# Patient Record
Sex: Male | Born: 1975
Health system: Southern US, Community
[De-identification: ages and names within clinical notes are randomized; demographics above are authoritative.]

## PROBLEM LIST (undated history)

## (undated) DIAGNOSIS — Z8619 Personal history of other infectious and parasitic diseases: Secondary | ICD-10-CM

## (undated) DIAGNOSIS — Z049 Encounter for examination and observation for unspecified reason: Secondary | ICD-10-CM

## (undated) HISTORY — DX: Personal history of other infectious and parasitic diseases: Z86.19

## (undated) HISTORY — DX: Encounter for examination and observation for unspecified reason: Z04.9

## (undated) HISTORY — PX: LASIK: SHX215

---

## 2014-01-17 ENCOUNTER — Telehealth: Payer: Self-pay

## 2014-01-17 NOTE — Telephone Encounter (Signed)
Medication List and allergies:  Updated   90 day supply/mail order: n/a [facility]  Immunization due:  UTD  A/P: Personal, family or PSH- Updated Flu- 09/2013 Tdap- Stated he has had within the last 3 years.     To discuss with provider: Not at this time.

## 2014-01-19 ENCOUNTER — Encounter: Payer: Self-pay | Admitting: Family Medicine

## 2014-01-19 ENCOUNTER — Ambulatory Visit (INDEPENDENT_AMBULATORY_CARE_PROVIDER_SITE_OTHER): Payer: 59 | Admitting: Family Medicine

## 2014-01-19 VITALS — BP 120/78 | HR 90 | Temp 98.2°F | Resp 16 | Ht 74.5 in | Wt 193.1 lb

## 2014-01-19 DIAGNOSIS — Z0000 Encounter for general adult medical examination without abnormal findings: Secondary | ICD-10-CM | POA: Insufficient documentation

## 2014-01-19 DIAGNOSIS — B079 Viral wart, unspecified: Secondary | ICD-10-CM

## 2014-01-19 LAB — BASIC METABOLIC PANEL
BUN: 19 mg/dL (ref 6–23)
CO2: 24 meq/L (ref 19–32)
Calcium: 9.8 mg/dL (ref 8.4–10.5)
Chloride: 111 mEq/L (ref 96–112)
Creatinine, Ser: 1.4 mg/dL (ref 0.4–1.5)
GFR: 61.42 mL/min (ref >60.00)
GLUCOSE: 94 mg/dL (ref 70–99)
POTASSIUM: 4.6 meq/L (ref 3.5–5.1)
SODIUM: 147 meq/L — AB (ref 135–145)

## 2014-01-19 LAB — HEPATIC FUNCTION PANEL
ALK PHOS: 59 U/L (ref 39–117)
ALT: 29 U/L (ref 0–53)
AST: 25 U/L (ref 0–37)
Albumin: 4.7 g/dL (ref 3.5–5.2)
Bilirubin, Direct: 0 mg/dL (ref 0.0–0.3)
TOTAL PROTEIN: 8.4 g/dL — AB (ref 6.0–8.3)
Total Bilirubin: 0.9 mg/dL (ref 0.3–1.2)

## 2014-01-19 LAB — CBC WITH DIFFERENTIAL/PLATELET
Basophils Absolute: 0.1 10*3/uL (ref 0.0–0.1)
Basophils Relative: 1.4 % (ref 0.0–3.0)
Eosinophils Absolute: 0.1 10*3/uL (ref 0.0–0.7)
Eosinophils Relative: 2 % (ref 0.0–5.0)
HEMATOCRIT: 43.7 % (ref 39.0–52.0)
Hemoglobin: 14.5 g/dL (ref 13.0–17.0)
LYMPHS ABS: 1.9 10*3/uL (ref 0.7–4.0)
Lymphocytes Relative: 37.5 % (ref 12.0–46.0)
MCHC: 33.2 g/dL (ref 30.0–36.0)
MCV: 92.3 fl (ref 78.0–100.0)
MONOS PCT: 6.9 % (ref 3.0–12.0)
Monocytes Absolute: 0.3 10*3/uL (ref 0.1–1.0)
NEUTROS PCT: 52.2 % (ref 43.0–77.0)
Neutro Abs: 2.7 10*3/uL (ref 1.4–7.7)
Platelets: 203 10*3/uL (ref 150.0–400.0)
RBC: 4.73 Mil/uL (ref 4.22–5.81)
RDW: 12.9 % (ref 11.5–14.6)
WBC: 5.1 10*3/uL (ref 4.5–10.5)

## 2014-01-19 LAB — LIPID PANEL
CHOL/HDL RATIO: 4
Cholesterol: 167 mg/dL (ref 0–200)
HDL: 39.5 mg/dL (ref >39.00)
LDL Cholesterol: 97 mg/dL (ref 0–99)
Triglycerides: 155 mg/dL — ABNORMAL HIGH (ref 0.0–149.0)
VLDL: 31 mg/dL (ref 0.0–40.0)

## 2014-01-19 LAB — TSH: TSH: 2.6 u[IU]/mL (ref 0.35–5.50)

## 2014-01-19 NOTE — Progress Notes (Signed)
Pre visit review using our clinic review tool, if applicable. No additional management support is needed unless otherwise documented below in the visit note. 

## 2014-01-19 NOTE — Patient Instructions (Signed)
Follow up in 1 year or as needed Neosporin to the thumb (the surrounding skin may ulcerate) We'll notify you of your lab results and make any changes if needed Keep up the good work! Call with any questions or concerns Welcome!  We're glad to have you!

## 2014-01-19 NOTE — Assessment & Plan Note (Signed)
New.  Pt tolerated 3 cycles of freeze/thaw w/ liquid nitrogen.  Will follow.

## 2014-01-19 NOTE — Assessment & Plan Note (Signed)
Pt's PE WNL.  Check labs.  Anticipatory guidance provided.  

## 2014-01-19 NOTE — Progress Notes (Signed)
   Subjective:    Patient ID: Louis Walters, male    DOB: [birth year], 38 y.o.   MRN: [id number]  HPI New to establish.  Previous MD- none.  CPE- no current concerns, has wart on R thumb pad.   Review of Systems Patient reports no vision/hearing changes, anorexia, fever ,adenopathy, persistant/recurrent hoarseness, swallowing issues, chest pain, palpitations, edema, persistant/recurrent cough, hemoptysis, dyspnea (rest,exertional, paroxysmal nocturnal), gastrointestinal  bleeding (melena, rectal bleeding), abdominal pain, excessive heart burn, GU symptoms (dysuria, hematuria, voiding/incontinence issues) syncope, focal weakness, memory loss, numbness & tingling, skin/hair/nail changes, depression, anxiety, abnormal bruising/bleeding, musculoskeletal symptoms/signs.     Objective:   Physical Exam BP 120/78  Pulse 90  Temp(Src) 98.2 F (36.8 C) (Oral)  Resp 16  Ht 6' 2.5" (1.892 m)  Wt 193 lb 2 oz (87.601 kg)  BMI 24.47 kg/m2  SpO2 98%  General Appearance:    Alert, cooperative, no distress, appears stated age  Head:    Normocephalic, without obvious abnormality, atraumatic  Eyes:    PERRL, conjunctiva/corneas clear, EOM's intact, fundi    benign, both eyes       Ears:    Normal TM's and external ear canals, both ears  Nose:   Nares normal, septum midline, mucosa normal, no drainage   or sinus tenderness  Throat:   Lips, mucosa, and tongue normal; teeth and gums normal  Neck:   Supple, symmetrical, trachea midline, no adenopathy;       thyroid:  No enlargement/tenderness/nodules  Back:     Symmetric, no curvature, ROM normal, no CVA tenderness  Lungs:     Clear to auscultation bilaterally, respirations unlabored  Chest wall:    No tenderness or deformity  Heart:    Regular rate and rhythm, S1 and S2 normal, no murmur, rub   or gallop  Abdomen:     Soft, non-tender, bowel sounds active all four quadrants,    no masses, no organomegaly  Genitalia:    Normal male without lesion,  discharge or tenderness  Rectal:    Deferred due to young age  Extremities:   Extremities normal, atraumatic, no cyanosis or edema  Pulses:   2+ and symmetric all extremities  Skin:   Skin color, texture, turgor normal, no rashes.  Wart w/ satellite lesions on R thumb pad  Lymph nodes:   Cervical, supraclavicular, and axillary nodes normal  Neurologic:   CNII-XII intact. Normal strength, sensation and reflexes      throughout          Assessment & Plan:

## 2014-01-22 ENCOUNTER — Encounter: Payer: Self-pay | Admitting: General Practice

## 2014-06-12 ENCOUNTER — Ambulatory Visit (INDEPENDENT_AMBULATORY_CARE_PROVIDER_SITE_OTHER): Payer: 59 | Admitting: Family Medicine

## 2014-06-12 ENCOUNTER — Encounter: Payer: Self-pay | Admitting: Family Medicine

## 2014-06-12 VITALS — BP 118/78 | HR 97 | Temp 98.1°F | Resp 16 | Wt 198.1 lb

## 2014-06-12 DIAGNOSIS — G4726 Circadian rhythm sleep disorder, shift work type: Secondary | ICD-10-CM

## 2014-06-12 DIAGNOSIS — B079 Viral wart, unspecified: Secondary | ICD-10-CM

## 2014-06-12 MED ORDER — TRAZODONE HCL 50 MG PO TABS: 25.0000 mg | ORAL_TABLET | Freq: Every evening | ORAL | Status: DC | PRN

## 2014-06-12 NOTE — Patient Instructions (Signed)
Follow up as needed You can continue to apply the Compound W on top of the freezing we did today If the surrounding skin blisters or burns- apply neosporin Start the Trazodone as needed for sleep Call with any questions or concerns Hang in there!

## 2014-06-12 NOTE — Assessment & Plan Note (Signed)
Pt has warts x3 on R thumb pad.  Tolerated 5 cycles of freeze/thaw w/ liquid nitrogen w/o difficulty.  Reviewed supportive care and ongoing treatment.  Will follow.

## 2014-06-12 NOTE — Assessment & Plan Note (Signed)
New.  Start Trazodone prn.  Pt to call if med is ineffective or he has side effects.  Pt expressed understanding and is in agreement w/ plan.

## 2014-06-12 NOTE — Progress Notes (Signed)
Pre visit review using our clinic review tool, if applicable. No additional management support is needed unless otherwise documented below in the visit note. 

## 2014-06-12 NOTE — Progress Notes (Signed)
   Subjective:    Patient ID: Louis Walters, male    DOB: July 28, 1976, 38 y.o.   MRN: [id number]  HPI Warts- R thumb pad x3, has been using OTC Compound W w/o desired results.  Wants them frozen w/ liquid Nitrogen today.  Sleep cycle disturbance- pt has to occasionally work 3rd shift and has difficulty sleeping when this happens.  Would like to have a sleep aide on hand for these occasions.   Review of Systems For ROS see HPI     Objective:   Physical Exam  Vitals reviewed. Constitutional: He is oriented to person, place, and time. He appears well-developed and well-nourished. No distress.  Neurological: He is alert and oriented to person, place, and time.  Skin: Skin is warm and dry.  3 nearly flat warts on R thumb pad, all <1 cm but largest w/ visible seed Pt tolerated 5 cycles of freeze/thaw w/ liquid nitrogen  Psychiatric: He has a normal mood and affect. His behavior is normal. Thought content normal.          Assessment & Plan:

## 2015-04-25 ENCOUNTER — Encounter: Payer: Self-pay | Admitting: Medical

## 2015-04-25 ENCOUNTER — Ambulatory Visit (HOSPITAL_BASED_OUTPATIENT_CLINIC_OR_DEPARTMENT_OTHER)
Admission: RE | Admit: 2015-04-25 | Discharge: 2015-04-25 | Disposition: A | Discharge status: Home / Self Care | Payer: 59 | Source: Ambulatory Visit | Attending: Medical | Admitting: Medical

## 2015-04-25 ENCOUNTER — Ambulatory Visit (INDEPENDENT_AMBULATORY_CARE_PROVIDER_SITE_OTHER): Payer: 59 | Admitting: Medical

## 2015-04-25 VITALS — BP 114/79 | HR 95 | Temp 98.3°F | Ht 74.5 in | Wt 201.0 lb

## 2015-04-25 DIAGNOSIS — M79671 Pain in right foot: Secondary | ICD-10-CM | POA: Diagnosis not present

## 2015-04-25 DIAGNOSIS — M79673 Pain in unspecified foot: Secondary | ICD-10-CM

## 2015-04-25 DIAGNOSIS — M79672 Pain in left foot: Secondary | ICD-10-CM | POA: Diagnosis present

## 2015-04-25 DIAGNOSIS — M545 Low back pain: Secondary | ICD-10-CM

## 2015-04-25 DIAGNOSIS — M549 Dorsalgia, unspecified: Secondary | ICD-10-CM | POA: Insufficient documentation

## 2015-04-25 DIAGNOSIS — M722 Plantar fascial fibromatosis: Secondary | ICD-10-CM | POA: Insufficient documentation

## 2015-04-25 NOTE — Patient Instructions (Signed)
Heel pain Recommend heel pads. Alleve or ibuprofen otc. Calf stretches. Xray today. If this persist then offer sports medicine referral.   Back pain Conservative measures. Stretches continue. Core abdomen exercises. If you desire can refer to PT and get xray lumbar spine with [facility]/    Recommend ellipitical or bike riding.  Follow up 2-3 wks or as needed.

## 2015-04-25 NOTE — Assessment & Plan Note (Signed)
Conservative measures. Stretches continue. Core abdomen exercises. If you desire can refer to PT and get xray lumbar spine with [facility]/

## 2015-04-25 NOTE — Assessment & Plan Note (Signed)
Recommend heel pads. Alleve or ibuprofen otc. Calf stretches. Xray today. If this persist then offer sports medicine referral.

## 2015-04-25 NOTE — Progress Notes (Signed)
Pre visit review using our clinic review tool, if applicable. No additional management support is needed unless otherwise documented below in the visit note. 

## 2015-04-25 NOTE — Progress Notes (Signed)
Subjective:    Patient ID: Louis Walters, male    DOB: 1976/08/25, 39 y.o.   MRN: [id number]  HPI  Pt in with some left foot pain. Pain for a 4-5  weeks. Pt thinks he has plantar fascitiis. Pt states pain escalated on Tuesday am. When he first gets up in am pain is severe. Pt on Monday jogged 3/4 of a mile. Pt just restarted jogging. Some faint heel pain rt heel.   Pt states also some mild lower back pain. Pt has seen chiropracter. Back pain has been going off and on for six months. Waxes and wanes. Pain now level 1-2. Pain never completley went away. No radiating pain. No weakness to legs.    Review of Systems  Constitutional: Negative for fever, chills, diaphoresis, activity change and fatigue.  Respiratory: Negative for cough, chest tightness and shortness of breath.   Cardiovascular: Negative for chest pain, palpitations and leg swelling.  Gastrointestinal: Negative for nausea, vomiting and abdominal pain.  Musculoskeletal: Negative for neck pain and neck stiffness.       Back pain minimal. Bilateral heel pain. Lt side>than rt side.  Neurological: Negative for dizziness, tremors, seizures, syncope, facial asymmetry, speech difficulty, weakness, light-headedness, numbness and headaches.  Psychiatric/Behavioral: Negative for behavioral problems, confusion and agitation. The patient is not nervous/anxious.      Past Medical History  Diagnosis Date  . No disease found   . History of chicken pox     History   Social History  . Marital Status: Married    Spouse Name: N/A  . Number of Children: N/A  . Years of Education: N/A   Occupational History  . Not on file.   Social History Main Topics  . Smoking status: Never Smoker   . Smokeless tobacco: Never Used  . Alcohol Use: No  . Drug Use: No  . Sexual Activity: Yes   Other Topics Concern  . Not on file   Social History Narrative    Past Surgical History  Procedure Laterality Date  . Lasik      Family  History  Problem Relation Age of Onset  . Osteoporosis Mother   . Cancer Maternal Grandfather     colon    Allergies  Allergen Reactions  . Celebrex [Celecoxib] Rash    Current Outpatient Prescriptions on File Prior to Visit  Medication Sig Dispense Refill  . traZODone (DESYREL) 50 MG tablet Take 0.5-1 tablets (25-50 mg total) by mouth at bedtime as needed for sleep. 30 tablet 3   No current facility-administered medications on file prior to visit.    BP 114/79 mmHg  Pulse 95  Temp(Src) 98.3 F (36.8 C) (Oral)  Ht 6' 2.5" (1.892 m)  Wt 201 lb (91.173 kg)  BMI 25.47 kg/m2  SpO2 98%       Objective:   Physical Exam   General Appearance- Not in acute distress.    Chest and Lung Exam Auscultation: Breath sounds:-Normal. Clear even and unlabored. Adventitious sounds:- No Adventitious sounds.  Cardiovascular Auscultation:Rythm - Regular, rate and rythm. Heart Sounds -Normal heart sounds.  Abdomen Inspection:-Inspection Normal.  Palpation/Perucssion: Palpation and Percussion of the abdomen reveal- Non Tender, No Rebound tenderness, No rigidity(Guarding) and No Palpable abdominal masses.  Liver:-Normal.  Spleen:- Normal.   Back  no Mid lumbar spine tenderness to palpation. Pain on straight leg lift. faint   Lower ext neurologic  L5-S1 sensation intact bilaterally. Normal patellar reflexes bilaterally. No foot drop bilaterally.  Lt foot- mild  heel pain to palpation bottom calcaneus and below achilles. But no pain on achilles. Rt foot- no obvious pain on palpation     Assessment & Plan:

## 2015-04-29 ENCOUNTER — Encounter: Payer: Self-pay | Admitting: Family Medicine

## 2015-04-29 ENCOUNTER — Ambulatory Visit (INDEPENDENT_AMBULATORY_CARE_PROVIDER_SITE_OTHER): Payer: 59 | Admitting: Family Medicine

## 2015-04-29 VITALS — BP 112/76 | HR 106 | Temp 98.2°F | Resp 16 | Ht 75.0 in | Wt 197.4 lb

## 2015-04-29 DIAGNOSIS — Z0000 Encounter for general adult medical examination without abnormal findings: Secondary | ICD-10-CM

## 2015-04-29 NOTE — Progress Notes (Signed)
   Subjective:    Patient ID: Louis Walters, male    DOB: [birth year], 39 y.o.   MRN: [id number]  HPI CPE- no concerns today.   Review of Systems Patient reports no vision/hearing changes, anorexia, fever ,adenopathy, persistant/recurrent hoarseness, swallowing issues, chest pain, palpitations, edema, persistant/recurrent cough, hemoptysis, dyspnea (rest,exertional, paroxysmal nocturnal), gastrointestinal  bleeding (melena, rectal bleeding), abdominal pain, excessive heart burn, GU symptoms (dysuria, hematuria, voiding/incontinence issues) syncope, focal weakness, memory loss, numbness & tingling, skin/hair/nail changes, depression, anxiety, abnormal bruising/bleeding, musculoskeletal symptoms/signs.     Objective:   Physical Exam General Appearance:    Alert, cooperative, no distress, appears stated age  Head:    Normocephalic, without obvious abnormality, atraumatic  Eyes:    PERRL, conjunctiva/corneas clear, EOM's intact, fundi    benign, both eyes       Ears:    Normal TM's and external ear canals, both ears  Nose:   Nares normal, septum midline, mucosa normal, no drainage   or sinus tenderness  Throat:   Lips, mucosa, and tongue normal; teeth and gums normal  Neck:   Supple, symmetrical, trachea midline, no adenopathy;       thyroid:  No enlargement/tenderness/nodules  Back:     Symmetric, no curvature, ROM normal, no CVA tenderness  Lungs:     Clear to auscultation bilaterally, respirations unlabored  Chest wall:    No tenderness or deformity  Heart:    Regular rate and rhythm, S1 and S2 normal, no murmur, rub   or gallop  Abdomen:     Soft, non-tender, bowel sounds active all four quadrants,    no masses, no organomegaly  Genitalia:    Deferred due to daughter's presence  Rectal:    Extremities:   Extremities normal, atraumatic, no cyanosis or edema  Pulses:   2+ and symmetric all extremities  Skin:   Skin color, texture, turgor normal, no rashes or lesions  Lymph nodes:    Cervical, supraclavicular, and axillary nodes normal  Neurologic:   CNII-XII intact. Normal strength, sensation and reflexes      throughout          Assessment & Plan:

## 2015-04-29 NOTE — Patient Instructions (Signed)
Follow up in 1 year or as needed We'll notify you of your lab results and make any changes if needed Keep up the good work!  You look great! Call with any questions or concerns Happy Spring!!! 

## 2015-04-29 NOTE — Progress Notes (Signed)
Pre visit review using our clinic review tool, if applicable. No additional management support is needed unless otherwise documented below in the visit note. 

## 2015-04-30 LAB — CBC WITH DIFFERENTIAL/PLATELET
BASOS PCT: 0.2 % (ref 0.0–3.0)
Basophils Absolute: 0 10*3/uL (ref 0.0–0.1)
EOS ABS: 0.1 10*3/uL (ref 0.0–0.7)
Eosinophils Relative: 2.3 % (ref 0.0–5.0)
HCT: 41.3 % (ref 39.0–52.0)
Hemoglobin: 14.1 g/dL (ref 13.0–17.0)
Lymphocytes Relative: 21.9 % (ref 12.0–46.0)
Lymphs Abs: 1.4 10*3/uL (ref 0.7–4.0)
MCHC: 34.2 g/dL (ref 30.0–36.0)
MCV: 89.7 fl (ref 78.0–100.0)
MONO ABS: 0.2 10*3/uL (ref 0.1–1.0)
Monocytes Relative: 3.2 % (ref 3.0–12.0)
NEUTROS ABS: 4.7 10*3/uL (ref 1.4–7.7)
Neutrophils Relative %: 72.4 % (ref 43.0–77.0)
Platelets: 199 10*3/uL (ref 150.0–400.0)
RBC: 4.61 Mil/uL (ref 4.22–5.81)
RDW: 12.8 % (ref 11.5–15.5)
WBC: 6.4 10*3/uL (ref 4.0–10.5)

## 2015-04-30 LAB — HEPATIC FUNCTION PANEL
ALBUMIN: 4.3 g/dL (ref 3.5–5.2)
ALK PHOS: 60 U/L (ref 39–117)
ALT: 30 U/L (ref 0–53)
AST: 30 U/L (ref 0–37)
Bilirubin, Direct: 0.1 mg/dL (ref 0.0–0.3)
Total Bilirubin: 0.6 mg/dL (ref 0.2–1.2)
Total Protein: 7.2 g/dL (ref 6.0–8.3)

## 2015-04-30 LAB — LIPID PANEL
CHOL/HDL RATIO: 5
CHOLESTEROL: 143 mg/dL (ref 0–200)
HDL: 30.3 mg/dL — ABNORMAL LOW (ref >39.00)
NONHDL: 112.7
Triglycerides: 329 mg/dL — ABNORMAL HIGH (ref 0.0–149.0)
VLDL: 65.8 mg/dL — ABNORMAL HIGH (ref 0.0–40.0)

## 2015-04-30 LAB — BASIC METABOLIC PANEL
BUN: 22 mg/dL (ref 6–23)
CHLORIDE: 102 meq/L (ref 96–112)
CO2: 30 mEq/L (ref 19–32)
Calcium: 9.6 mg/dL (ref 8.4–10.5)
Creatinine, Ser: 1.5 mg/dL (ref 0.40–1.50)
GFR: 55.86 mL/min — ABNORMAL LOW (ref >60.00)
Glucose, Bld: 89 mg/dL (ref 70–99)
Potassium: 4.4 mEq/L (ref 3.5–5.1)
SODIUM: 139 meq/L (ref 135–145)

## 2015-04-30 LAB — TSH: TSH: 1.56 u[IU]/mL (ref 0.35–4.50)

## 2015-04-30 LAB — LDL CHOLESTEROL, DIRECT: LDL DIRECT: 73 mg/dL

## 2015-04-30 NOTE — Assessment & Plan Note (Signed)
Pt's PE WNL.  Pt instructed to do home testicular exam as we were not able to do this in office today w/ young daughter present.  Check labs.  Anticipatory guidance provided.

## 2015-05-01 ENCOUNTER — Other Ambulatory Visit: Payer: Self-pay | Admitting: Family Medicine

## 2015-05-01 DIAGNOSIS — E781 Pure hyperglyceridemia: Secondary | ICD-10-CM

## 2015-08-05 ENCOUNTER — Encounter: Payer: Self-pay | Admitting: Family Medicine

## 2015-08-05 DIAGNOSIS — M79673 Pain in unspecified foot: Secondary | ICD-10-CM

## 2015-08-05 NOTE — Telephone Encounter (Signed)
Referral placed.

## 2015-08-08 ENCOUNTER — Encounter: Payer: Self-pay | Admitting: Family Medicine

## 2015-08-08 ENCOUNTER — Ambulatory Visit (INDEPENDENT_AMBULATORY_CARE_PROVIDER_SITE_OTHER): Payer: 59 | Admitting: Family Medicine

## 2015-08-08 VITALS — BP 129/92 | Ht 75.0 in | Wt 190.0 lb

## 2015-08-08 DIAGNOSIS — M722 Plantar fascial fibromatosis: Secondary | ICD-10-CM | POA: Diagnosis not present

## 2015-08-08 NOTE — Patient Instructions (Signed)
You have plantar fasciitis Take tylenol or aleve as needed for pain  Plantar fascia stretch for 20-30 seconds (do 3 of these) in morning Lowering/raise on a step exercises 3 x 10 once or twice a day - this is very important for long term recovery. Can add heel walks, toe walks forward and backward as well Ice heel for 15 minutes as needed. Avoid flat shoes/barefoot walking as much as possible. Arch straps have been shown to help with pain. Try your inserts in the new shoes for a couple weeks - call the [place] office [phone] if you want to try the green insoles. Steroid injection is a consideration for short term pain relief if you are struggling. Physical therapy is also an option. Follow up with me in 6 weeks for reevaluation.

## 2015-08-12 NOTE — Assessment & Plan Note (Signed)
Shown home exercises and stretches to do daily.  Icing, arch binders, avoid flat shoes/barefoot walking.  Inserts with good arch support.  Consider PT, injection, sports insoles if not improving.  F/u in 6 weeks.

## 2015-08-12 NOTE — Progress Notes (Signed)
PCP and referred by: Neena Rhymes, MD  Subjective:   HPI: Patient is a 39 y.o. male here for bilateral heel pain.  Patient reports for over 6 months he has had bilateral heel pain. Left heel worse than right. Not sure if this started by wearing bad shoes. Pain worse in middle of the night. Tried night splints on his own without much benefit. Pain radiates up side of ankle bilaterally. Doing stretches but only intermittently.  Past Medical History  Diagnosis Date  . No disease found   . History of chicken pox     No current outpatient prescriptions on file prior to visit.   No current facility-administered medications on file prior to visit.    Past Surgical History  Procedure Laterality Date  . Lasik      Allergies  Allergen Reactions  . Celebrex [Celecoxib] Rash    Social History   Social History  . Marital Status: Married    Spouse Name: N/A  . Number of Children: N/A  . Years of Education: N/A   Occupational History  . Not on file.   Social History Main Topics  . Smoking status: Never Smoker   . Smokeless tobacco: Never Used  . Alcohol Use: No  . Drug Use: No  . Sexual Activity: Yes   Other Topics Concern  . Not on file   Social History Narrative    Family History  Problem Relation Age of Onset  . Osteoporosis Mother   . Cancer Maternal Grandfather     colon    BP 129/92 mmHg  Ht  (1.905 m)  Wt 190 lb (86.183 kg)  BMI 23.75 kg/m2  Review of Systems: See HPI above.    Objective:  Physical Exam:  Gen: NAD  Bilateral feet/ankles: No gross deformity, swelling, ecchymoses FROM TTP bilateral calcanei at plantar fasciae insertion.  Negative ant drawer and talar tilt.   Negative syndesmotic compression. Negative calcaneal squeeze. Thompsons test negative. NV intact distally.    Assessment & Plan:  1. Bilateral plantar fasciitis - Shown home exercises and stretches to do daily.  Icing, arch binders, avoid flat  shoes/barefoot walking.  Inserts with good arch support.  Consider PT, injection, sports insoles if not improving.  F/u in 6 weeks.

## 2015-09-17 ENCOUNTER — Ambulatory Visit: Payer: 59 | Admitting: Family Medicine

## 2015-11-04 ENCOUNTER — Other Ambulatory Visit: Payer: 59

## 2015-11-08 ENCOUNTER — Other Ambulatory Visit (INDEPENDENT_AMBULATORY_CARE_PROVIDER_SITE_OTHER): Payer: 59

## 2015-11-08 ENCOUNTER — Encounter: Payer: Self-pay | Admitting: Podiatry

## 2015-11-08 ENCOUNTER — Ambulatory Visit (INDEPENDENT_AMBULATORY_CARE_PROVIDER_SITE_OTHER): Payer: 59 | Admitting: Podiatry

## 2015-11-08 VITALS — BP 104/74 | HR 87 | Resp 16

## 2015-11-08 DIAGNOSIS — E781 Pure hyperglyceridemia: Secondary | ICD-10-CM

## 2015-11-08 DIAGNOSIS — M722 Plantar fascial fibromatosis: Secondary | ICD-10-CM | POA: Diagnosis not present

## 2015-11-08 DIAGNOSIS — Q828 Other specified congenital malformations of skin: Secondary | ICD-10-CM

## 2015-11-08 LAB — HEPATIC FUNCTION PANEL
ALK PHOS: 65 U/L (ref 39–117)
ALT: 24 U/L (ref 0–53)
AST: 19 U/L (ref 0–37)
Albumin: 4.6 g/dL (ref 3.5–5.2)
BILIRUBIN DIRECT: 0.1 mg/dL (ref 0.0–0.3)
BILIRUBIN TOTAL: 0.8 mg/dL (ref 0.2–1.2)
Total Protein: 7.5 g/dL (ref 6.0–8.3)

## 2015-11-08 LAB — LIPID PANEL
Cholesterol: 151 mg/dL (ref 0–200)
HDL: 36.3 mg/dL — ABNORMAL LOW (ref >39.00)
LDL CALC: 90 mg/dL (ref 0–99)
NonHDL: 114.59
Total CHOL/HDL Ratio: 4
Triglycerides: 121 mg/dL (ref 0.0–149.0)
VLDL: 24.2 mg/dL (ref 0.0–40.0)

## 2015-11-08 MED ORDER — TRIAMCINOLONE ACETONIDE 10 MG/ML IJ SUSP
10.0000 mg | Freq: Once | INTRAMUSCULAR | Status: AC
  Administered 2015-11-08: 10 mg

## 2015-11-08 MED ORDER — MELOXICAM 15 MG PO TABS: 15.0000 mg | ORAL_TABLET | Freq: Every day | ORAL | Status: DC

## 2015-11-08 NOTE — Progress Notes (Signed)
   Subjective:    Patient ID: Louis LarssonFrank R Walters, male    DOB: Dec 04, 1976, 39 y.o.   MRN: [id number]  HPI Comments: "I have this wart or something on my foot and the heels are hurting"  Patient presents with: Skin Problem: Sub 1st MPJ left - small, callused area for 4-6 months, tender when barefoot, getting larger-no treatment. Foot Pain: Plantar heel bilateral (L>R) - aching for about 6 months, AM pain, went to PCP and sports med doc and they recommended stretching and OTC orthotics-not getting better.    Foot Pain      Review of Systems  All other systems reviewed and are negative.      Objective:   Physical Exam        Assessment & Plan:

## 2015-11-08 NOTE — Patient Instructions (Signed)

## 2015-11-09 NOTE — Progress Notes (Signed)
Subjective:     Patient ID: Louis LarssonFrank R Walters, male   DOB: 06-Feb-1976, 39 y.o.   MRN: [id number]  HPI patient presents stating I have had trouble with my heel for about 6 months with the left being much worse in the right and difficulty with walking. I've also had a small lesion left foot that's localized and is occasionally tender   Review of Systems  All other systems reviewed and are negative.      Objective:   Physical Exam  Constitutional: He is oriented to person, place, and time.  Cardiovascular: Intact distal pulses.   Musculoskeletal: Normal range of motion.  Neurological: He is oriented to person, place, and time.  Skin: Skin is warm.  Nursing note and vitals reviewed.  neurovascular status found to be intact muscle strength adequate range of motion within normal limits with tall individual with a long narrow foot and narrow heel. 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 He is noted to have a small lesion distal metatarsal left second that is lucent in it's appearance and patient does have good digital perfusion and is well oriented 3     Assessment:     Plantar fasciitis heel left over right with inflammation and fluid around the medial band and distal keratotic lesion left    Plan:     H&P and x-rays and structural condition reviewed with patient. Today I injected the left plantar fascia 3 mg Kenalog 5 mg Xylocaine and applied fascial brace with instructions and instructed on physical therapy and placed on oral anti-inflammatory treatment. I then abraded the lesion left forefoot and patient will be seen back and discussed long-term orthotic therapy for this patient

## 2015-11-21 ENCOUNTER — Ambulatory Visit (INDEPENDENT_AMBULATORY_CARE_PROVIDER_SITE_OTHER): Payer: 59 | Admitting: Podiatry

## 2015-11-21 ENCOUNTER — Encounter: Payer: Self-pay | Admitting: Podiatry

## 2015-11-21 VITALS — BP 122/80 | HR 73 | Resp 16

## 2015-11-21 DIAGNOSIS — M722 Plantar fascial fibromatosis: Secondary | ICD-10-CM

## 2015-11-24 NOTE — Progress Notes (Signed)
Subjective:     Patient ID: Louis LarssonFrank R Walters, male   DOB: 1976/03/29, 39 y.o.   MRN: [id number]  HPI patient presents stating I am improved but I'm still having pain in my heels of both feet and this is been a long-term problem   Review of Systems     Objective:   Physical Exam Neurovascular status intact muscle strength adequate with continued discomfort in the plantar fashion bilateral with inflammation and fluid around the medial band    Assessment:     Plantar fasciitis bilateral heels    Plan:     Discussed long-term plan her fasciitis and the chronic nature of condition and arch height and at this time scanned for custom orthotics to reduce the stress on the plantar heels

## 2015-12-17 ENCOUNTER — Ambulatory Visit: Payer: 59 | Admitting: *Deleted

## 2015-12-17 DIAGNOSIS — M722 Plantar fascial fibromatosis: Secondary | ICD-10-CM

## 2015-12-17 NOTE — Progress Notes (Signed)
Patient ID: Louis LarssonFrank R Walters, male   DOB: [birth year], 39 y.o.   MRN: [id number] Patient presents for orthotic pick up.  Verbal and written break in and wear instructions given.  Patient will follow up in 4 weeks if symptoms worsen or fail to improve.

## 2015-12-17 NOTE — Patient Instructions (Signed)

## 2016-02-05 MED FILL — MELOXICAM 15 MG TABLET: 15 | 30 days supply | Qty: 30 | Fill #1

## 2017-05-06 ENCOUNTER — Ambulatory Visit (INDEPENDENT_AMBULATORY_CARE_PROVIDER_SITE_OTHER): Payer: 59 | Admitting: Physician Assistant

## 2017-05-06 ENCOUNTER — Encounter: Payer: Self-pay | Admitting: Physician Assistant

## 2017-05-06 VITALS — BP 126/88 | HR 89 | Temp 98.2°F | Resp 14 | Ht 75.0 in | Wt 202.0 lb

## 2017-05-06 DIAGNOSIS — H6983 Other specified disorders of Eustachian tube, bilateral: Secondary | ICD-10-CM

## 2017-05-06 MED ORDER — PREDNISONE 20 MG PO TABS: 40.0000 mg | ORAL_TABLET | Freq: Every day | ORAL | 0 refills | Status: DC

## 2017-05-06 MED FILL — predniSONE 20 MG TABS: 20 | 3 days supply | Qty: 6 | Fill #0

## 2017-05-06 NOTE — Progress Notes (Signed)
Pre visit review using our clinic review tool, if applicable. No additional management support is needed unless otherwise documented below in the visit note. 

## 2017-05-06 NOTE — Patient Instructions (Signed)
Try a claritin-D daily along with your Flonase. If you cannot tolerate the Claritin D, try an OTC Xyzal once daily instead.  Take the steroid as directed for 3 days to help.   If not improving we need to set you up with ENT.

## 2017-05-06 NOTE — Progress Notes (Signed)
   Patient presents to clinic today c/o intermittent pressure of ears bilaterally over the past month. Notes intermittent muffled hearing with some occasional tinnitus. Some nasal congestion. Denies sinus pressure/pain. Denies true ear aches. Notes cracking and popping sounds in the ear. Has been taking Claritin and Flonase without much improvement. Did try Sudafed for about a week -- helped but made him feel loopy.   Past Medical History:  Diagnosis Date  . History of chicken pox   . No disease found     No current outpatient prescriptions on file prior to visit.   No current facility-administered medications on file prior to visit.     Allergies  Allergen Reactions  . Celebrex [Celecoxib] Rash    Family History  Problem Relation Age of Onset  . Osteoporosis Mother   . Cancer Maternal Grandfather        colon    Social History   Social History  . Marital status: Married    Spouse name: N/A  . Number of children: N/A  . Years of education: N/A   Social History Main Topics  . Smoking status: Never Smoker  . Smokeless tobacco: Never Used  . Alcohol use No  . Drug use: No  . Sexual activity: Yes   Other Topics Concern  . None   Social History Narrative  . None   Review of Systems - See HPI.  All other ROS are negative.  BP 126/88   Pulse 89   Temp 98.2 F (36.8 C) (Oral)   Resp 14   Ht 6\' 3"  (1.905 m)   Wt 202 lb (91.6 kg)   SpO2 99%   BMI 25.25 kg/m   Physical Exam  Constitutional: He is oriented to person, place, and time and well-developed, well-nourished, and in no distress.  HENT:  Head: Normocephalic and atraumatic.  Right Ear: External ear normal. A middle ear effusion is present.  Left Ear: External ear normal. A middle ear effusion is present.  Nose: Nose normal. Right sinus exhibits no maxillary sinus tenderness and no frontal sinus tenderness. Left sinus exhibits no maxillary sinus tenderness and no frontal sinus tenderness.  Mouth/Throat:  Uvula is midline and oropharynx is clear and moist.  Eyes: Conjunctivae are normal.  Neck: Neck supple.  Cardiovascular: Normal rate, regular rhythm, normal heart sounds and intact distal pulses.   Pulmonary/Chest: Effort normal and breath sounds normal. No respiratory distress. He has no wheezes. He has no rales. He exhibits no tenderness.  Neurological: He is alert and oriented to person, place, and time.  Skin: Skin is warm and dry. No rash noted.  Vitals reviewed.  Assessment/Plan: 1. Eustachian tube dysfunction, bilateral Attempt start of Claritin D. If unable to tolerate, switch to Xyzal. Continue Flonase. Will attempt 3-day burst of prednisone. Will refer to ENT if not improving. - predniSONE (DELTASONE) 20 MG tablet; Take 2 tablets (40 mg total) by mouth daily with breakfast.  Dispense: 6 tablet; Refill: 0   Piedad ClimesMartin, Jing Howatt Cody, [place] JerseyPA-C

## 2017-07-30 ENCOUNTER — Encounter: Payer: 59 | Admitting: Family Medicine

## 2017-08-10 ENCOUNTER — Ambulatory Visit (INDEPENDENT_AMBULATORY_CARE_PROVIDER_SITE_OTHER): Payer: 59 | Admitting: Family Medicine

## 2017-08-10 ENCOUNTER — Encounter: Payer: Self-pay | Admitting: Family Medicine

## 2017-08-10 VITALS — BP 120/76 | HR 91 | Temp 98.2°F | Resp 17 | Ht 75.0 in | Wt 203.5 lb

## 2017-08-10 DIAGNOSIS — Z0000 Encounter for general adult medical examination without abnormal findings: Secondary | ICD-10-CM | POA: Diagnosis not present

## 2017-08-10 LAB — HEPATIC FUNCTION PANEL
ALBUMIN: 4.3 g/dL (ref 3.5–5.2)
ALK PHOS: 54 U/L (ref 39–117)
ALT: 22 U/L (ref 0–53)
AST: 20 U/L (ref 0–37)
Bilirubin, Direct: 0.2 mg/dL (ref 0.0–0.3)
Total Bilirubin: 0.9 mg/dL (ref 0.2–1.2)
Total Protein: 7.3 g/dL (ref 6.0–8.3)

## 2017-08-10 LAB — CBC WITH DIFFERENTIAL/PLATELET
Basophils Absolute: 0.1 10*3/uL (ref 0.0–0.1)
Basophils Relative: 1.9 % (ref 0.0–3.0)
EOS ABS: 0.1 10*3/uL (ref 0.0–0.7)
Eosinophils Relative: 2.6 % (ref 0.0–5.0)
HCT: 42.3 % (ref 39.0–52.0)
HEMOGLOBIN: 14.5 g/dL (ref 13.0–17.0)
LYMPHS ABS: 1.4 10*3/uL (ref 0.7–4.0)
Lymphocytes Relative: 32.7 % (ref 12.0–46.0)
MCHC: 34.2 g/dL (ref 30.0–36.0)
MCV: 90.9 fl (ref 78.0–100.0)
MONO ABS: 0.3 10*3/uL (ref 0.1–1.0)
Monocytes Relative: 6.9 % (ref 3.0–12.0)
NEUTROS PCT: 55.9 % (ref 43.0–77.0)
Neutro Abs: 2.5 10*3/uL (ref 1.4–7.7)
PLATELETS: 202 10*3/uL (ref 150.0–400.0)
RBC: 4.66 Mil/uL (ref 4.22–5.81)
RDW: 12.4 % (ref 11.5–15.5)
WBC: 4.4 10*3/uL (ref 4.0–10.5)

## 2017-08-10 LAB — BASIC METABOLIC PANEL
BUN: 15 mg/dL (ref 6–23)
CHLORIDE: 102 meq/L (ref 96–112)
CO2: 34 meq/L — AB (ref 19–32)
CREATININE: 1.3 mg/dL (ref 0.40–1.50)
Calcium: 9.5 mg/dL (ref 8.4–10.5)
GFR: 65.1 mL/min (ref >60.00)
GLUCOSE: 98 mg/dL (ref 70–99)
Potassium: 4.2 mEq/L (ref 3.5–5.1)
Sodium: 138 mEq/L (ref 135–145)

## 2017-08-10 LAB — LIPID PANEL
CHOL/HDL RATIO: 4
Cholesterol: 156 mg/dL (ref 0–200)
HDL: 37 mg/dL — AB (ref >39.00)
LDL Cholesterol: 92 mg/dL (ref 0–99)
NonHDL: 119.02
Triglycerides: 137 mg/dL (ref 0.0–149.0)
VLDL: 27.4 mg/dL (ref 0.0–40.0)

## 2017-08-10 LAB — TSH: TSH: 2.4 u[IU]/mL (ref 0.35–4.50)

## 2017-08-10 NOTE — Assessment & Plan Note (Signed)
Pt's PE WNL.  UTD on Tdap.  Pt will get flu shot at work.  Check labs.  Anticipatory guidance provided.

## 2017-08-10 NOTE — Progress Notes (Signed)
Pre visit review using our clinic review tool, if applicable. No additional management support is needed unless otherwise documented below in the visit note. 

## 2017-08-10 NOTE — Progress Notes (Signed)
   Subjective:    Patient ID: Louis Walters, male    DOB: Mar 14, 1976, 41 y.o.   MRN: [id number]  HPI CPE- UTD on Tdap, will get flu shot at work.  No concerns today.   Review of Systems Patient reports no vision/hearing changes, anorexia, fever ,adenopathy, persistant/recurrent hoarseness, swallowing issues, chest pain, palpitations, edema, persistant/recurrent cough, hemoptysis, dyspnea (rest,exertional, paroxysmal nocturnal), gastrointestinal  bleeding (melena, rectal bleeding), abdominal pain, excessive heart burn, GU symptoms (dysuria, hematuria, voiding/incontinence issues) syncope, focal weakness, memory loss, numbness & tingling, skin/hair/nail changes, depression, anxiety, abnormal bruising/bleeding, musculoskeletal symptoms/signs.     Objective:   Physical Exam General Appearance:    Alert, cooperative, no distress, appears stated age  Head:    Normocephalic, without obvious abnormality, atraumatic  Eyes:    PERRL, conjunctiva/corneas clear, EOM's intact, fundi    benign, both eyes       Ears:    Normal TM's and external ear canals, both ears  Nose:   Nares normal, septum midline, mucosa normal, no drainage   or sinus tenderness  Throat:   Lips, mucosa, and tongue normal; teeth and gums normal  Neck:   Supple, symmetrical, trachea midline, no adenopathy;       thyroid:  No enlargement/tenderness/nodules  Back:     Symmetric, no curvature, ROM normal, no CVA tenderness  Lungs:     Clear to auscultation bilaterally, respirations unlabored  Chest wall:    No tenderness or deformity  Heart:    Regular rate and rhythm, S1 and S2 normal, no murmur, rub   or gallop  Abdomen:     Soft, non-tender, bowel sounds active all four quadrants,    no masses, no organomegaly  Genitalia:    Normal male without lesion, masses,discharge or tenderness  Rectal:    Deferred due to young age  Extremities:   Extremities normal, atraumatic, no cyanosis or edema  Pulses:   2+ and symmetric all  extremities  Skin:   Skin color, texture, turgor normal, no rashes or lesions  Lymph nodes:   Cervical, supraclavicular, and axillary nodes normal  Neurologic:   CNII-XII intact. Normal strength, sensation and reflexes      throughout          Assessment & Plan:

## 2017-08-10 NOTE — Patient Instructions (Signed)
Follow up in 1 year or as needed We'll notify you of your lab results and make any changes if needed Keep up the good work on healthy diet and regular exercise- you look great! Start daily Zyrtec and Flonase to improve the crackling and popping in the ears Call with any questions or concerns Enjoy the rest of your summer!!

## 2017-08-11 ENCOUNTER — Encounter: Payer: Self-pay | Admitting: General Practice

## 2017-10-30 ENCOUNTER — Encounter: Payer: Self-pay | Admitting: Family Medicine

## 2018-07-05 ENCOUNTER — Telehealth: Payer: No Typology Code available for payment source | Admitting: Physician Assistant

## 2018-07-05 DIAGNOSIS — H9202 Otalgia, left ear: Secondary | ICD-10-CM

## 2018-07-05 MED ORDER — NEOMYCIN-POLYMYXIN-HC 3.5-10000-1 OT SOLN: 4.0000 [drp] | Freq: Four times a day (QID) | OTIC | 0 refills | Status: DC

## 2018-07-05 NOTE — Progress Notes (Signed)
E Visit for Swimmer's Ear  We are sorry that you are not feeling well. Here is how we plan to help!  I have prescribed: Neomycin 0.35%, polymyxin B 10,000 units/mL, and hydrocortisone 0,5% otic solution 4 drops in affected ears four times a day until completed    In certain cases swimmer's ear may progress to a more serious bacterial infection of the middle or inner ear.  If you have a fever 102 and up and significantly worsening symptoms, this could indicate a more serious infection moving to the middle/inner and needs face to face evaluation in an office by a provider.  Your symptoms should improve over the next 3 days and should resolve in about 7 days.  HOME CARE:   Wash your hands frequently.  Do not place the tip of the bottle on your ear or touch it with your fingers.  You can take Acetominophen 650 mg every 4-6 hours as needed for pain.  If pain is severe or moderate, you can apply a heating pad (set on low) or hot water bottle (wrapped in a towel) to outer ear for 20 minutes.  This will also increase drainage.  Avoid ear plugs  Do not use Q-tips  After showers, help the water run out by tilting your head to one side.  GET HELP RIGHT AWAY IF:   Fever is over 102.2 degrees.  You develop progressive ear pain or hearing loss.  Ear symptoms persist longer than 3 days after treatment.  MAKE SURE YOU:   Understand these instructions.  Will watch your condition.  Will get help right away if you are not doing well or get worse.  TO PREVENT SWIMMER'S EAR:  Use a bathing cap or custom fitted swim molds to keep your ears dry.  Towel off after swimming to dry your ears.  Tilt your head or pull your earlobes to allow the water to escape your ear canal.  If there is still water in your ears, consider using a hairdryer on the lowest setting.  Thank you for choosing an e-visit. Your e-visit answers were reviewed by a board certified advanced clinical practitioner to  complete your personal care plan. Depending upon the condition, your plan could have included both over the counter or prescription medications. Please review your pharmacy choice. Be sure that the pharmacy you have chosen is open so that you can pick up your prescription now.  If there is a problem you may message your provider in MyChart to have the prescription routed to another pharmacy. Your safety is important to us. If you have drug allergies check your prescription carefully.  For the next 24 hours, you can use MyChart to ask questions about today's visit, request a non-urgent call back, or ask for a work or school excuse from your e-visit provider. You will get an email in the next two days asking about your experience. I hope that your e-visit has been valuable and will speed your recovery.      

## 2018-09-02 ENCOUNTER — Encounter: Payer: Self-pay | Admitting: Family Medicine

## 2018-09-02 ENCOUNTER — Ambulatory Visit (INDEPENDENT_AMBULATORY_CARE_PROVIDER_SITE_OTHER): Payer: No Typology Code available for payment source | Admitting: Family Medicine

## 2018-09-02 ENCOUNTER — Other Ambulatory Visit: Payer: Self-pay

## 2018-09-02 VITALS — BP 102/76 | HR 85 | Temp 98.1°F | Resp 16 | Ht 75.0 in | Wt 204.5 lb

## 2018-09-02 DIAGNOSIS — Z1283 Encounter for screening for malignant neoplasm of skin: Secondary | ICD-10-CM | POA: Diagnosis not present

## 2018-09-02 DIAGNOSIS — Z3009 Encounter for other general counseling and advice on contraception: Secondary | ICD-10-CM | POA: Diagnosis not present

## 2018-09-02 DIAGNOSIS — H6983 Other specified disorders of Eustachian tube, bilateral: Secondary | ICD-10-CM

## 2018-09-02 DIAGNOSIS — B070 Plantar wart: Secondary | ICD-10-CM | POA: Diagnosis not present

## 2018-09-02 NOTE — Progress Notes (Signed)
   Subjective:    Patient ID: Louis Walters, male    DOB: 10/18/1976, 42 y.o.   MRN: [id number]  HPI Ear pressure- 'a nagging, crackling with a little bit of pressure'.  Bilateral but L>R.  No drainage.  Denies pain.  Some mild change in hearing.  No current allergy medications or nasal sprays- had used these for a few months but did not improve his sxs.  sxs started ~1 yr ago and no improvement w/ round of steroids and combination allergy medication.   Review of Systems For ROS see HPI     Objective:   Physical Exam  Constitutional: He is oriented to person, place, and time. He appears well-developed and well-nourished. No distress.  HENT:  Head: Normocephalic and atraumatic.  No TTP over sinuses + turbinate edema + PND TMs retracted bilaterally  Eyes: Pupils are equal, round, and reactive to light. Conjunctivae and EOM are normal.  Neck: Normal range of motion. Neck supple.  Cardiovascular: Normal rate, regular rhythm and normal heart sounds.  Pulmonary/Chest: Effort normal and breath sounds normal. No respiratory distress. He has no wheezes.  Lymphadenopathy:    He has no cervical adenopathy.  Neurological: He is alert and oriented to person, place, and time.  Skin: Skin is warm and dry.  Vitals reviewed.         Assessment & Plan:  Eustachian tube dysfxn- ongoing issue for pt.  Symptomatic x1 yr despite use of OTC antihistamines, decongestants, and nasal steroids.  Restart Flonase while waiting for ENT referral.  Pt expressed understanding and is in agreement w/ plan.   Vasectomy Evaluation- referral placed

## 2018-09-02 NOTE — Patient Instructions (Signed)
Follow up as needed Restart the Flonase- 2 sprays each nostril daily We'll call you with your ENT appt Drink plenty of fluids Call with any questions or concerns Hang in there!!

## 2018-09-12 ENCOUNTER — Ambulatory Visit (INDEPENDENT_AMBULATORY_CARE_PROVIDER_SITE_OTHER): Payer: No Typology Code available for payment source | Admitting: Physician Assistant

## 2018-09-12 ENCOUNTER — Other Ambulatory Visit: Payer: Self-pay

## 2018-09-12 ENCOUNTER — Encounter: Payer: Self-pay | Admitting: Physician Assistant

## 2018-09-12 VITALS — BP 110/78 | HR 73 | Temp 97.8°F | Resp 14 | Ht 75.0 in | Wt 205.0 lb

## 2018-09-12 DIAGNOSIS — Z0000 Encounter for general adult medical examination without abnormal findings: Secondary | ICD-10-CM | POA: Insufficient documentation

## 2018-09-12 LAB — CBC WITH DIFFERENTIAL/PLATELET
Basophils Absolute: 0.1 10*3/uL (ref 0.0–0.1)
Basophils Relative: 2.1 % (ref 0.0–3.0)
EOS PCT: 1.6 % (ref 0.0–5.0)
Eosinophils Absolute: 0.1 10*3/uL (ref 0.0–0.7)
HCT: 41.5 % (ref 39.0–52.0)
Hemoglobin: 14.1 g/dL (ref 13.0–17.0)
Lymphocytes Relative: 30.4 % (ref 12.0–46.0)
Lymphs Abs: 1.5 10*3/uL (ref 0.7–4.0)
MCHC: 33.9 g/dL (ref 30.0–36.0)
MCV: 90.4 fl (ref 78.0–100.0)
Monocytes Absolute: 0.3 10*3/uL (ref 0.1–1.0)
Monocytes Relative: 6.5 % (ref 3.0–12.0)
Neutro Abs: 3 10*3/uL (ref 1.4–7.7)
Neutrophils Relative %: 59.4 % (ref 43.0–77.0)
PLATELETS: 195 10*3/uL (ref 150.0–400.0)
RBC: 4.59 Mil/uL (ref 4.22–5.81)
RDW: 13.6 % (ref 11.5–15.5)
WBC: 5 10*3/uL (ref 4.0–10.5)

## 2018-09-12 LAB — COMPREHENSIVE METABOLIC PANEL
ALT: 29 U/L (ref 0–53)
AST: 23 U/L (ref 0–37)
Albumin: 4.4 g/dL (ref 3.5–5.2)
Alkaline Phosphatase: 57 U/L (ref 39–117)
BUN: 21 mg/dL (ref 6–23)
CHLORIDE: 103 meq/L (ref 96–112)
CO2: 29 meq/L (ref 19–32)
Calcium: 9.3 mg/dL (ref 8.4–10.5)
Creatinine, Ser: 1.34 mg/dL (ref 0.40–1.50)
GFR: 62.52 mL/min (ref >60.00)
Glucose, Bld: 92 mg/dL (ref 70–99)
Potassium: 4.4 mEq/L (ref 3.5–5.1)
SODIUM: 138 meq/L (ref 135–145)
Total Bilirubin: 0.6 mg/dL (ref 0.2–1.2)
Total Protein: 7.1 g/dL (ref 6.0–8.3)

## 2018-09-12 LAB — HEMOGLOBIN A1C: HEMOGLOBIN A1C: 5.2 % (ref 4.6–6.5)

## 2018-09-12 LAB — LIPID PANEL
Cholesterol: 154 mg/dL (ref 0–200)
HDL: 33 mg/dL — AB (ref >39.00)
LDL CALC: 86 mg/dL (ref 0–99)
NonHDL: 121.15
Total CHOL/HDL Ratio: 5
Triglycerides: 176 mg/dL — ABNORMAL HIGH (ref 0.0–149.0)
VLDL: 35.2 mg/dL (ref 0.0–40.0)

## 2018-09-12 NOTE — Patient Instructions (Signed)
Please go to the lab for blood work.   Our office will call you with your results unless you have chosen to receive results via MyChart.  If your blood work is normal we will follow-up each year for physicals and as scheduled for chronic medical problems.  If anything is abnormal we will treat accordingly and get you in for a follow-up.   Preventive Care 40-64 Years, Male Preventive care refers to lifestyle choices and visits with your health care provider that can promote health and wellness. What does preventive care include?  A yearly physical exam. This is also called an annual well check.  Dental exams once or twice a year.  Routine eye exams. Ask your health care provider how often you should have your eyes checked.  Personal lifestyle choices, including: ? Daily care of your teeth and gums. ? Regular physical activity. ? Eating a healthy diet. ? Avoiding tobacco and drug use. ? Limiting alcohol use. ? Practicing safe sex. ? Taking low-dose aspirin every day starting at age 50. What happens during an annual well check? The services and screenings done by your health care provider during your annual well check will depend on your age, overall health, lifestyle risk factors, and family history of disease. Counseling Your health care provider may ask you questions about your:  Alcohol use.  Tobacco use.  Drug use.  Emotional well-being.  Home and relationship well-being.  Sexual activity.  Eating habits.  Work and work environment.  Screening You may have the following tests or measurements:  Height, weight, and BMI.  Blood pressure.  Lipid and cholesterol levels. These may be checked every 5 years, or more frequently if you are over 50 years old.  Skin check.  Lung cancer screening. You may have this screening every year starting at age 55 if you have a 30-pack-year history of smoking and currently smoke or have quit within the past 15 years.  Fecal  occult blood test (FOBT) of the stool. You may have this test every year starting at age 50.  Flexible sigmoidoscopy or colonoscopy. You may have a sigmoidoscopy every 5 years or a colonoscopy every 10 years starting at age 50.  Prostate cancer screening. Recommendations will vary depending on your family history and other risks.  Hepatitis C blood test.  Hepatitis B blood test.  Sexually transmitted disease (STD) testing.  Diabetes screening. This is done by checking your blood sugar (glucose) after you have not eaten for a while (fasting). You may have this done every 1-3 years.  Discuss your test results, treatment options, and if necessary, the need for more tests with your health care provider. Vaccines Your health care provider may recommend certain vaccines, such as:  Influenza vaccine. This is recommended every year.  Tetanus, diphtheria, and acellular pertussis (Tdap, Td) vaccine. You may need a Td booster every 10 years.  Varicella vaccine. You may need this if you have not been vaccinated.  Zoster vaccine. You may need this after age 60.  Measles, mumps, and rubella (MMR) vaccine. You may need at least one dose of MMR if you were born in 1957 or later. You may also need a second dose.  Pneumococcal 13-valent conjugate (PCV13) vaccine. You may need this if you have certain conditions and have not been vaccinated.  Pneumococcal polysaccharide (PPSV23) vaccine. You may need one or two doses if you smoke cigarettes or if you have certain conditions.  Meningococcal vaccine. You may need this if you have certain   conditions.  Hepatitis A vaccine. You may need this if you have certain conditions or if you travel or work in places where you may be exposed to hepatitis A.  Hepatitis B vaccine. You may need this if you have certain conditions or if you travel or work in places where you may be exposed to hepatitis B.  Haemophilus influenzae type b (Hib) vaccine. You may need  this if you have certain risk factors.  Talk to your health care provider about which screenings and vaccines you need and how often you need them. This information is not intended to replace advice given to you by your health care provider. Make sure you discuss any questions you have with your health care provider. Document Released: 01/03/2016 Document Revised: 08/26/2016 Document Reviewed: 10/08/2015 Elsevier Interactive Patient Education  2018 [employer] .      

## 2018-09-12 NOTE — Assessment & Plan Note (Signed)
Depression screen negative. Health Maintenance reviewed. Flu shot up-to-date. Preventive schedule discussed and handout given in AVS. Will obtain fasting labs today.

## 2018-09-12 NOTE — Progress Notes (Signed)
Patient presents to clinic today for annual exam.  Patient is fasting for labs.  Diet -- Notes is ok overall but could be better. Is trying to cut out all fast foods.  Exercise -- Is currently doing aerobics and walking daily.   Health Maintenance: Immunizations -- up-to-date.  Past Medical History:  Diagnosis Date  . History of chicken pox   . No disease found     Past Surgical History:  Procedure Laterality Date  . LASIK      No current outpatient medications on file prior to visit.   No current facility-administered medications on file prior to visit.     Allergies  Allergen Reactions  . Celebrex [Celecoxib] Rash    Family History  Problem Relation Age of Onset  . Osteoporosis Mother   . Cancer Maternal Grandfather 3160       colon    Social History   Socioeconomic History  . Marital status: Married    Spouse name: Not on file  . Number of children: Not on file  . Years of education: Not on file  . Highest education level: Not on file  Occupational History  . Not on file  Social Needs  . Financial resource strain: Not on file  . Food insecurity:    Worry: Not on file    Inability: Not on file  . Transportation needs:    Medical: Not on file    Non-medical: Not on file  Tobacco Use  . Smoking status: Never Smoker  . Smokeless tobacco: Never Used  Substance and Sexual Activity  . Alcohol use: No  . Drug use: No  . Sexual activity: Yes  Lifestyle  . Physical activity:    Days per week: Not on file    Minutes per session: Not on file  . Stress: Not on file  Relationships  . Social connections:    Talks on phone: Not on file    Gets together: Not on file    Attends religious service: Not on file    Active member of club or organization: Not on file    Attends meetings of clubs or organizations: Not on file    Relationship status: Not on file  . Intimate partner violence:    Fear of current or ex partner: Not on file    Emotionally abused:  Not on file    Physically abused: Not on file    Forced sexual activity: Not on file  Other Topics Concern  . Not on file  Social History Narrative  . Not on file   Review of Systems  Constitutional: Negative for fever and weight loss.  HENT: Negative for ear discharge, ear pain, hearing loss and tinnitus.   Eyes: Negative for blurred vision, double vision, photophobia and pain.  Respiratory: Negative for cough and shortness of breath.   Cardiovascular: Negative for chest pain and palpitations.  Gastrointestinal: Negative for abdominal pain, blood in stool, constipation, diarrhea, heartburn, melena, nausea and vomiting.  Genitourinary: Negative for dysuria, flank pain, frequency, hematuria and urgency.  Musculoskeletal: Negative for falls.  Neurological: Negative for dizziness, loss of consciousness and headaches.  Endo/Heme/Allergies: Negative for environmental allergies.  Psychiatric/Behavioral: Negative for depression, hallucinations, substance abuse and suicidal ideas. The patient is not nervous/anxious and does not have insomnia.    BP 110/78   Pulse 73   Temp 97.8 F (36.6 C) (Oral)   Resp 14   Ht 6\' 3"  (1.905 m)   Wt 205 lb (  93 kg)   SpO2 99%   BMI 25.62 kg/m   Physical Exam  Constitutional: He is oriented to person, place, and time. He appears well-developed and well-nourished. No distress.  HENT:  Head: Normocephalic and atraumatic.  Right Ear: Tympanic membrane, external ear and ear canal normal.  Left Ear: Tympanic membrane, external ear and ear canal normal.  Nose: Nose normal.  Mouth/Throat: Oropharynx is clear and moist and mucous membranes are normal. No posterior oropharyngeal edema or posterior oropharyngeal erythema.  Eyes: Pupils are equal, round, and reactive to light. Conjunctivae are normal.  Neck: Neck supple. No thyromegaly present.  Cardiovascular: Normal rate, regular rhythm, normal heart sounds and intact distal pulses.  Pulmonary/Chest: Effort  normal and breath sounds normal. No respiratory distress. He has no wheezes. He has no rales. He exhibits no tenderness.  Abdominal: Soft. Bowel sounds are normal. He exhibits no distension and no mass. There is no tenderness. There is no rebound and no guarding.  Lymphadenopathy:    He has no cervical adenopathy.  Neurological: He is alert and oriented to person, place, and time. No cranial nerve deficit.  Skin: Skin is warm and dry. No rash noted. He is not diaphoretic.  Psychiatric: He has a normal mood and affect.  Vitals reviewed.  Assessment/Plan: Visit for preventive health examination Depression screen negative. Health Maintenance reviewed. Flu shot up-to-date. Preventive schedule discussed and handout given in AVS. Will obtain fasting labs today.     Piedad Climes, PA-C

## 2018-09-28 ENCOUNTER — Encounter: Payer: Self-pay | Admitting: Family Medicine

## 2019-07-22 DIAGNOSIS — U071 COVID-19: Secondary | ICD-10-CM

## 2019-07-22 HISTORY — DX: COVID-19: U07.1

## 2019-09-26 ENCOUNTER — Other Ambulatory Visit: Payer: Self-pay

## 2019-09-26 ENCOUNTER — Ambulatory Visit (INDEPENDENT_AMBULATORY_CARE_PROVIDER_SITE_OTHER): Payer: No Typology Code available for payment source | Admitting: Family Medicine

## 2019-09-26 ENCOUNTER — Encounter: Payer: Self-pay | Admitting: Family Medicine

## 2019-09-26 VITALS — BP 120/80 | HR 79 | Temp 97.5°F | Resp 16 | Ht 74.0 in | Wt 210.4 lb

## 2019-09-26 DIAGNOSIS — E663 Overweight: Secondary | ICD-10-CM | POA: Diagnosis not present

## 2019-09-26 DIAGNOSIS — Z0000 Encounter for general adult medical examination without abnormal findings: Secondary | ICD-10-CM

## 2019-09-26 LAB — LIPID PANEL
Cholesterol: 190 mg/dL (ref 0–200)
HDL: 36.7 mg/dL — ABNORMAL LOW (ref >39.00)
LDL Cholesterol: 114 mg/dL — ABNORMAL HIGH (ref 0–99)
NonHDL: 153.36
Total CHOL/HDL Ratio: 5
Triglycerides: 195 mg/dL — ABNORMAL HIGH (ref 0.0–149.0)
VLDL: 39 mg/dL (ref 0.0–40.0)

## 2019-09-26 LAB — CBC WITH DIFFERENTIAL/PLATELET
Basophils Absolute: 0.2 10*3/uL — ABNORMAL HIGH (ref 0.0–0.1)
Basophils Relative: 4.2 % — ABNORMAL HIGH (ref 0.0–3.0)
Eosinophils Absolute: 0.1 10*3/uL (ref 0.0–0.7)
Eosinophils Relative: 2.3 % (ref 0.0–5.0)
HCT: 44.1 % (ref 39.0–52.0)
Hemoglobin: 14.7 g/dL (ref 13.0–17.0)
Lymphocytes Relative: 34.4 % (ref 12.0–46.0)
Lymphs Abs: 1.5 10*3/uL (ref 0.7–4.0)
MCHC: 33.4 g/dL (ref 30.0–36.0)
MCV: 90.9 fl (ref 78.0–100.0)
Monocytes Absolute: 0.3 10*3/uL (ref 0.1–1.0)
Monocytes Relative: 8 % (ref 3.0–12.0)
Neutro Abs: 2.2 10*3/uL (ref 1.4–7.7)
Neutrophils Relative %: 51.1 % (ref 43.0–77.0)
Platelets: 204 10*3/uL (ref 150.0–400.0)
RBC: 4.86 Mil/uL (ref 4.22–5.81)
RDW: 13.3 % (ref 11.5–15.5)
WBC: 4.3 10*3/uL (ref 4.0–10.5)

## 2019-09-26 LAB — HEPATIC FUNCTION PANEL
ALT: 30 U/L (ref 0–53)
AST: 16 U/L (ref 0–37)
Albumin: 4.7 g/dL (ref 3.5–5.2)
Alkaline Phosphatase: 64 U/L (ref 39–117)
Bilirubin, Direct: 0.1 mg/dL (ref 0.0–0.3)
Total Bilirubin: 0.7 mg/dL (ref 0.2–1.2)
Total Protein: 7.4 g/dL (ref 6.0–8.3)

## 2019-09-26 LAB — BASIC METABOLIC PANEL
BUN: 15 mg/dL (ref 6–23)
CO2: 28 mEq/L (ref 19–32)
Calcium: 9.6 mg/dL (ref 8.4–10.5)
Chloride: 102 mEq/L (ref 96–112)
Creatinine, Ser: 1.29 mg/dL (ref 0.40–1.50)
GFR: 61.15 mL/min (ref >60.00)
Glucose, Bld: 92 mg/dL (ref 70–99)
Potassium: 4.8 mEq/L (ref 3.5–5.1)
Sodium: 137 mEq/L (ref 135–145)

## 2019-09-26 LAB — TSH: TSH: 1.97 u[IU]/mL (ref 0.35–4.50)

## 2019-09-26 NOTE — Assessment & Plan Note (Signed)
Pt's PE WNL.  UTD on immunizations.  Check labs.  Anticipatory guidance provided.  

## 2019-09-26 NOTE — Patient Instructions (Signed)
Follow up in 1 year or as needed We'll notify you of your lab results and make any changes if needed Continue to work on healthy diet and regular exercise- you can do it! Call with any questions or concerns Stay Safe and Healthy!!

## 2019-09-26 NOTE — Progress Notes (Signed)
   Subjective:    Patient ID: Louis Walters, male    DOB: 16-Nov-1976, 43 y.o.   MRN: [id number]  HPI CPE- UTD on Tdap and flu.  Pt has gained 5 lbs since last visit.  Pt had COVID 2 months ago   Review of Systems Patient reports no vision/hearing changes, anorexia, fever ,adenopathy, persistant/recurrent hoarseness, swallowing issues, chest pain, palpitations, edema, persistant/recurrent cough, hemoptysis, dyspnea (rest,exertional, paroxysmal nocturnal), gastrointestinal  bleeding (melena, rectal bleeding), abdominal pain, excessive heart burn, GU symptoms (dysuria, hematuria, voiding/incontinence issues) syncope, focal weakness, memory loss, numbness & tingling, skin/hair/nail changes, depression, anxiety, abnormal bruising/bleeding, musculoskeletal symptoms/signs.     Objective:   Physical Exam General Appearance:    Alert, cooperative, no distress, appears stated age  Head:    Normocephalic, without obvious abnormality, atraumatic  Eyes:    PERRL, conjunctiva/corneas clear, EOM's intact, fundi    benign, both eyes       Ears:    Normal TM's and external ear canals, both ears  Nose:   Deferred due to COVID  Throat:   Neck:   Supple, symmetrical, trachea midline, no adenopathy;       thyroid:  No enlargement/tenderness/nodules  Back:     Symmetric, no curvature, ROM normal, no CVA tenderness  Lungs:     Clear to auscultation bilaterally, respirations unlabored  Chest wall:    No tenderness or deformity  Heart:    Regular rate and rhythm, S1 and S2 normal, no murmur, rub   or gallop  Abdomen:     Soft, non-tender, bowel sounds active all four quadrants,    no masses, no organomegaly  Genitalia:    Normal male without lesion, masses,discharge or tenderness  Rectal:    Deferred due to young age  Extremities:   Extremities normal, atraumatic, no cyanosis or edema  Pulses:   2+ and symmetric all extremities  Skin:   Skin color, texture, turgor normal, no rashes or lesions  Lymph nodes:    Cervical, supraclavicular, and axillary nodes normal  Neurologic:   CNII-XII intact. Normal strength, sensation and reflexes      throughout          Assessment & Plan:

## 2019-09-26 NOTE — Assessment & Plan Note (Signed)
Pt has gained 5 lbs since last visit.  Stressed need for healthy diet and regular exercise.  Check labs to risk stratify.  Will follow. 

## 2019-09-27 ENCOUNTER — Encounter: Payer: Self-pay | Admitting: General Practice

## 2020-01-29 ENCOUNTER — Ambulatory Visit (INDEPENDENT_AMBULATORY_CARE_PROVIDER_SITE_OTHER): Payer: No Typology Code available for payment source | Admitting: Psychology

## 2020-01-29 DIAGNOSIS — F4323 Adjustment disorder with mixed anxiety and depressed mood: Secondary | ICD-10-CM | POA: Diagnosis not present

## 2020-02-13 ENCOUNTER — Ambulatory Visit (INDEPENDENT_AMBULATORY_CARE_PROVIDER_SITE_OTHER): Payer: No Typology Code available for payment source | Admitting: Psychology

## 2020-02-13 DIAGNOSIS — F411 Generalized anxiety disorder: Secondary | ICD-10-CM | POA: Diagnosis not present

## 2020-02-14 ENCOUNTER — Ambulatory Visit: Payer: No Typology Code available for payment source | Admitting: Psychology

## 2020-02-21 ENCOUNTER — Ambulatory Visit: Payer: No Typology Code available for payment source | Admitting: Psychology

## 2020-02-22 ENCOUNTER — Ambulatory Visit (INDEPENDENT_AMBULATORY_CARE_PROVIDER_SITE_OTHER): Payer: No Typology Code available for payment source | Admitting: Psychology

## 2020-02-22 DIAGNOSIS — F411 Generalized anxiety disorder: Secondary | ICD-10-CM | POA: Diagnosis not present

## 2020-02-28 ENCOUNTER — Ambulatory Visit (INDEPENDENT_AMBULATORY_CARE_PROVIDER_SITE_OTHER): Payer: No Typology Code available for payment source | Admitting: Psychology

## 2020-02-28 DIAGNOSIS — F411 Generalized anxiety disorder: Secondary | ICD-10-CM

## 2020-04-25 ENCOUNTER — Ambulatory Visit (INDEPENDENT_AMBULATORY_CARE_PROVIDER_SITE_OTHER): Payer: No Typology Code available for payment source | Admitting: Psychology

## 2020-04-25 DIAGNOSIS — F411 Generalized anxiety disorder: Secondary | ICD-10-CM

## 2020-06-11 ENCOUNTER — Ambulatory Visit (INDEPENDENT_AMBULATORY_CARE_PROVIDER_SITE_OTHER): Payer: No Typology Code available for payment source | Admitting: Psychology

## 2020-06-11 DIAGNOSIS — F411 Generalized anxiety disorder: Secondary | ICD-10-CM

## 2020-06-19 ENCOUNTER — Ambulatory Visit (INDEPENDENT_AMBULATORY_CARE_PROVIDER_SITE_OTHER): Payer: No Typology Code available for payment source | Admitting: Psychology

## 2020-06-19 DIAGNOSIS — F411 Generalized anxiety disorder: Secondary | ICD-10-CM

## 2020-07-01 ENCOUNTER — Ambulatory Visit: Payer: No Typology Code available for payment source | Admitting: Psychology

## 2020-07-16 ENCOUNTER — Ambulatory Visit: Payer: No Typology Code available for payment source | Admitting: Psychology

## 2020-07-23 ENCOUNTER — Ambulatory Visit: Payer: No Typology Code available for payment source | Admitting: Psychology

## 2020-11-07 ENCOUNTER — Ambulatory Visit: Payer: No Typology Code available for payment source | Admitting: Family Medicine

## 2020-11-07 ENCOUNTER — Encounter: Payer: Self-pay | Admitting: Family Medicine

## 2020-11-07 ENCOUNTER — Telehealth: Payer: Self-pay

## 2020-11-07 NOTE — Telephone Encounter (Signed)
Called patient's wife to inform. She voiced understanding.

## 2020-11-07 NOTE — Progress Notes (Deleted)
    Subjective:    CC: R knee pain  I, Hamdi Vari, LAT, ATC, am serving as scribe for Dr. Clementeen Graham.  HPI: Pt is a 44 y/o male presenting w/ c/o R knee pain.  He locates his pain to .  Radiating pain: R knee swelling: R knee mechanical symptoms: Aggravating factors: Treatments tried:  Pertinent review of Systems: ***  Relevant historical information: ***   Objective:   There were no vitals filed for this visit. General: Well Developed, well nourished, and in no acute distress.   MSK: ***  Lab and Radiology Results No results found for this or any previous visit (from the past 72 hour(s)). No results found.    Impression and Recommendations:    Assessment and Plan: 44 y.o. male with ***.  PDMP not reviewed this encounter. No orders of the defined types were placed in this encounter.  No orders of the defined types were placed in this encounter.   Discussed warning signs or symptoms. Please see discharge instructions. Patient expresses understanding.   ***

## 2020-11-07 NOTE — Telephone Encounter (Signed)
Patient's wife called in to see if patient could get a referral to sports medicine. He has been having some right knee pain since jumping on the trampoline with kids. She states with their insurance, he needs a referral. Patient's wife states that sports medicine can see him today at 1:45 (Dr. Katrinka Blazing).

## 2020-11-07 NOTE — Telephone Encounter (Signed)
Has not been seen in over a year. PCP out of office. Referral would need to come from her.

## 2020-11-08 ENCOUNTER — Other Ambulatory Visit: Payer: Self-pay

## 2020-11-08 DIAGNOSIS — M25561 Pain in right knee: Secondary | ICD-10-CM

## 2020-11-08 DIAGNOSIS — Z3009 Encounter for other general counseling and advice on contraception: Secondary | ICD-10-CM

## 2020-11-08 NOTE — Telephone Encounter (Signed)
Referral has been placed to Sports Med at [facility] for rt knee pain

## 2020-11-08 NOTE — Telephone Encounter (Signed)
Ok for sports med referral Nestor Ramp)- dx R knee pain

## 2020-11-20 NOTE — Progress Notes (Signed)
   Subjective:    I'm seeing this patient as a consultation for:  Dr. Beverely Low. Note will be routed back to referring provider/PCP.  CC: R knee pain  I, Molly Weber, LAT, ATC, am serving as scribe for Dr. Clementeen Graham.  HPI: Pt is a 44 y/o male presenting w/ c/o L knee pain x3-4 week. He locates the pain to around patellar tendon and medial joint line. Possible MOI when jumping on trampoline w/ kids. Sharp pain when first standing up last week, but hasn't experienced sharp pain since then.  L knee swelling: slight Aggravating factors: none Treatments tried: IBU  Past medical history, Surgical history, Family history, Social history, Allergies, and medications have been entered into the medical record, reviewed.   Review of Systems: No new headache, visual changes, nausea, vomiting, diarrhea, constipation, dizziness, abdominal pain, skin rash, fevers, chills, night sweats, weight loss, swollen lymph nodes, body aches, joint swelling, muscle aches, chest pain, shortness of breath, mood changes, visual or auditory hallucinations.   Objective:    Vitals:   11/21/20 0848  BP: 112/78  Pulse: 84  SpO2: 99%   General: Well Developed, well nourished, and in no acute distress.  Neuro/Psych: Alert and oriented x3, extra-ocular muscles intact, able to move all 4 extremities, sensation grossly intact. Skin: Warm and dry, no rashes noted.  Respiratory: Not using accessory muscles, speaking in full sentences, trachea midline.  Cardiovascular: Pulses palpable, no extremity edema. Abdomen: Does not appear distended. MSK: Left knee normal-appearing no swelling. Mother tender palpation overlying proximal patellar tendon with palpable squeak. Stable ligaments exam.  Intact strength.  Negative McMurray's test.  Lab and Radiology Results Diagnostic Limited MSK Ultrasound of: Left knee Quad tendon intact normal-appearing No significant joint effusion present.  Next the patella tendon is intact  normal-appearing slight hypoechoic change present superficial to tendon. Medial and lateral joint line normal-appearing Posterior knee no Baker's cyst. Impression: Prepatellar bursitis  X-ray images left knee obtained today personally and independently interpreted Slight calcification superficial to patella on sunrise view otherwise knee is normal. Await formal radiology review   Impression and Recommendations:    Assessment and Plan: 44 y.o. male with left knee pain due to prepatellar bursitis and possibly patellar tendinitis.  Doubtful for serious intra-articular pathology.  Plan for topical diclofenac gel and eccentric exercises as taught in clinic today by ATC.  Check back if not improving.  Could consider injection or even MRI in the future if needed.Marland Kitchen  PDMP not reviewed this encounter. Orders Placed This Encounter  Procedures  . [place] LIMITED JOINT SPACE STRUCTURES LOW RIGHT(NO LINKED CHARGES)    Order Specific Question:   Reason for Exam (SYMPTOM  OR DIAGNOSIS REQUIRED)    Answer:   R knee pain    Order Specific Question:   Preferred imaging location?    Answer:   [facility]   No orders of the defined types were placed in this encounter.   Discussed warning signs or symptoms. Please see discharge instructions. Patient expresses understanding.   The above documentation has been reviewed and is accurate and complete Clementeen Graham, M.D.

## 2020-11-21 ENCOUNTER — Other Ambulatory Visit: Payer: Self-pay | Admitting: Family Medicine

## 2020-11-21 ENCOUNTER — Ambulatory Visit (INDEPENDENT_AMBULATORY_CARE_PROVIDER_SITE_OTHER): Payer: No Typology Code available for payment source | Admitting: Family Medicine

## 2020-11-21 ENCOUNTER — Other Ambulatory Visit: Payer: Self-pay

## 2020-11-21 ENCOUNTER — Ambulatory Visit: Payer: Self-pay

## 2020-11-21 ENCOUNTER — Encounter: Payer: Self-pay | Admitting: Family Medicine

## 2020-11-21 ENCOUNTER — Ambulatory Visit (INDEPENDENT_AMBULATORY_CARE_PROVIDER_SITE_OTHER): Payer: No Typology Code available for payment source

## 2020-11-21 VITALS — BP 112/78 | HR 84 | Ht 74.0 in | Wt 216.2 lb

## 2020-11-21 DIAGNOSIS — M25562 Pain in left knee: Secondary | ICD-10-CM

## 2020-11-21 DIAGNOSIS — M25561 Pain in right knee: Secondary | ICD-10-CM

## 2020-11-21 NOTE — Patient Instructions (Addendum)
Thank you for coming in today.  Use the voltaren gel up to 4x daily.  Do the exercises we reviewed. View at [email or web address]-exercise-[email or web address] using code: GPUFNEV  Please get an Xray today before you leave  Let me know if not improving.   I think you have prepatellar bursitis or patellar tendonitis.

## 2020-11-21 NOTE — Progress Notes (Signed)
X-ray left knee shows no severe arthritis.  Largely normal to radiology.

## 2020-12-11 ENCOUNTER — Other Ambulatory Visit: Payer: Self-pay

## 2020-12-11 ENCOUNTER — Encounter: Payer: Self-pay | Admitting: Family Medicine

## 2020-12-11 ENCOUNTER — Ambulatory Visit (INDEPENDENT_AMBULATORY_CARE_PROVIDER_SITE_OTHER): Payer: No Typology Code available for payment source | Admitting: Family Medicine

## 2020-12-11 VITALS — BP 122/80 | HR 88 | Temp 97.7°F | Resp 19 | Ht 74.0 in | Wt 218.0 lb

## 2020-12-11 DIAGNOSIS — E663 Overweight: Secondary | ICD-10-CM

## 2020-12-11 DIAGNOSIS — Z1159 Encounter for screening for other viral diseases: Secondary | ICD-10-CM | POA: Diagnosis not present

## 2020-12-11 DIAGNOSIS — Z114 Encounter for screening for human immunodeficiency virus [HIV]: Secondary | ICD-10-CM | POA: Diagnosis not present

## 2020-12-11 DIAGNOSIS — Z0000 Encounter for general adult medical examination without abnormal findings: Secondary | ICD-10-CM | POA: Diagnosis not present

## 2020-12-11 LAB — BASIC METABOLIC PANEL
BUN: 17 mg/dL (ref 6–23)
CO2: 29 mEq/L (ref 19–32)
Calcium: 9.3 mg/dL (ref 8.4–10.5)
Chloride: 101 mEq/L (ref 96–112)
Creatinine, Ser: 1.4 mg/dL (ref 0.40–1.50)
GFR: 61.79 mL/min (ref >60.00)
Glucose, Bld: 90 mg/dL (ref 70–99)
Potassium: 4.4 mEq/L (ref 3.5–5.1)
Sodium: 137 mEq/L (ref 135–145)

## 2020-12-11 LAB — CBC WITH DIFFERENTIAL/PLATELET
Basophils Absolute: 0.1 10*3/uL (ref 0.0–0.1)
Basophils Relative: 1.9 % (ref 0.0–3.0)
Eosinophils Absolute: 0.1 10*3/uL (ref 0.0–0.7)
Eosinophils Relative: 1.7 % (ref 0.0–5.0)
HCT: 43.7 % (ref 39.0–52.0)
Hemoglobin: 15.2 g/dL (ref 13.0–17.0)
Lymphocytes Relative: 31.6 % (ref 12.0–46.0)
Lymphs Abs: 1.8 10*3/uL (ref 0.7–4.0)
MCHC: 34.7 g/dL (ref 30.0–36.0)
MCV: 88.8 fl (ref 78.0–100.0)
Monocytes Absolute: 0.3 10*3/uL (ref 0.1–1.0)
Monocytes Relative: 6 % (ref 3.0–12.0)
Neutro Abs: 3.3 10*3/uL (ref 1.4–7.7)
Neutrophils Relative %: 58.8 % (ref 43.0–77.0)
Platelets: 201 10*3/uL (ref 150.0–400.0)
RBC: 4.92 Mil/uL (ref 4.22–5.81)
RDW: 12.8 % (ref 11.5–15.5)
WBC: 5.6 10*3/uL (ref 4.0–10.5)

## 2020-12-11 LAB — LIPID PANEL
Cholesterol: 199 mg/dL (ref 0–200)
HDL: 36.3 mg/dL — ABNORMAL LOW (ref >39.00)
NonHDL: 162.96
Total CHOL/HDL Ratio: 5
Triglycerides: 229 mg/dL — ABNORMAL HIGH (ref 0.0–149.0)
VLDL: 45.8 mg/dL — ABNORMAL HIGH (ref 0.0–40.0)

## 2020-12-11 LAB — HEPATIC FUNCTION PANEL
ALT: 39 U/L (ref 0–53)
AST: 25 U/L (ref 0–37)
Albumin: 4.6 g/dL (ref 3.5–5.2)
Alkaline Phosphatase: 65 U/L (ref 39–117)
Bilirubin, Direct: 0.1 mg/dL (ref 0.0–0.3)
Total Bilirubin: 0.7 mg/dL (ref 0.2–1.2)
Total Protein: 7.4 g/dL (ref 6.0–8.3)

## 2020-12-11 LAB — TSH: TSH: 2.71 u[IU]/mL (ref 0.35–4.50)

## 2020-12-11 LAB — LDL CHOLESTEROL, DIRECT: Direct LDL: 104 mg/dL

## 2020-12-11 NOTE — Patient Instructions (Signed)
Follow up in 1 year or as needed We'll notify you of your lab results and make any changes if needed Keep up the good work on healthy diet and regular exercise- you look great! Call with any questions or concerns Stay Safe!  Stay Healthy! Happy Holidays!! HAPPY BIRTHDAY!!!!

## 2020-12-11 NOTE — Assessment & Plan Note (Signed)
Pt's PE WNL.  UTD on immunizations.  Check labs.  Anticipatory guidance provided.  

## 2020-12-11 NOTE — Progress Notes (Signed)
   Subjective:    Patient ID: Louis Walters, male    DOB: 01/28/1976, 44 y.o.   MRN: [id number]  HPI CPE- UTD on COVID, flu, Tdap.  No concerns today  Reviewed past medical, surgical, family and social histories.   Health Maintenance  Topic Date Due  . Hepatitis C Screening  Never done  . HIV Screening  Never done  . INFLUENZA VACCINE  07/21/2020  . TETANUS/TDAP  01/17/2021      Review of Systems Patient reports no vision/hearing changes, anorexia, fever ,adenopathy, persistant/recurrent hoarseness, swallowing issues, chest pain, palpitations, edema, persistant/recurrent cough, hemoptysis, dyspnea (rest,exertional, paroxysmal nocturnal), gastrointestinal  bleeding (melena, rectal bleeding), abdominal pain, excessive heart burn, GU symptoms (dysuria, hematuria, voiding/incontinence issues) syncope, focal weakness, memory loss, numbness & tingling, skin/hair/nail changes, depression, anxiety, abnormal bruising/bleeding, musculoskeletal symptoms/signs.   This visit occurred during the SARS-CoV-2 public health emergency.  Safety protocols were in place, including screening questions prior to the visit, additional usage of staff PPE, and extensive cleaning of exam room while observing appropriate contact time as indicated for disinfecting solutions.       Objective:   Physical Exam General Appearance:    Alert, cooperative, no distress, appears stated age  Head:    Normocephalic, without obvious abnormality, atraumatic  Eyes:    PERRL, conjunctiva/corneas clear, EOM's intact, fundi    benign, both eyes       Ears:    Normal TM's and external ear canals, both ears  Nose:   Deferred due to COVID  Throat:   Neck:   Supple, symmetrical, trachea midline, no adenopathy;       thyroid:  No enlargement/tenderness/nodules  Back:     Symmetric, no curvature, ROM normal, no CVA tenderness  Lungs:     Clear to auscultation bilaterally, respirations unlabored  Chest wall:    No tenderness or  deformity  Heart:    Regular rate and rhythm, S1 and S2 normal, no murmur, rub   or gallop  Abdomen:     Soft, non-tender, bowel sounds active all four quadrants,    no masses, no organomegaly  Genitalia:    Deferred  Rectal:    Extremities:   Extremities normal, atraumatic, no cyanosis or edema  Pulses:   2+ and symmetric all extremities  Skin:   Skin color, texture, turgor normal, no rashes or lesions  Lymph nodes:   Cervical, supraclavicular, and axillary nodes normal  Neurologic:   CNII-XII intact. Normal strength, sensation and reflexes      throughout          Assessment & Plan:

## 2020-12-11 NOTE — Assessment & Plan Note (Signed)
Pt's BMI is now 27.99.  Encouraged healthy diet and regular exercise.  Check labs to risk stratify.  Will follow.

## 2020-12-12 LAB — HEPATITIS C ANTIBODY
Hepatitis C Ab: NONREACTIVE
SIGNAL TO CUT-OFF: 0.01 (ref <1.00)

## 2020-12-12 LAB — HIV ANTIBODY (ROUTINE TESTING W REFLEX): HIV 1&2 Ab, 4th Generation: NONREACTIVE

## 2020-12-30 ENCOUNTER — Other Ambulatory Visit (HOSPITAL_COMMUNITY): Payer: Self-pay | Admitting: Urology

## 2021-02-19 MED FILL — DIAZEPAM 10 MG TABS: 10 | 1 days supply | Qty: 1 | Fill #0

## 2021-05-22 ENCOUNTER — Other Ambulatory Visit (HOSPITAL_COMMUNITY): Payer: Self-pay

## 2021-05-22 MED ORDER — CARESTART COVID-19 HOME TEST VI KIT
PACK | 0 refills | Status: DC
  Filled 2021-05-22: qty 4, 4d supply, fill #0

## 2021-06-18 ENCOUNTER — Encounter: Payer: Self-pay | Admitting: *Deleted

## 2021-11-09 ENCOUNTER — Telehealth: Payer: No Typology Code available for payment source | Admitting: Family

## 2021-11-09 DIAGNOSIS — H6982 Other specified disorders of Eustachian tube, left ear: Secondary | ICD-10-CM

## 2021-11-09 DIAGNOSIS — H9312 Tinnitus, left ear: Secondary | ICD-10-CM | POA: Diagnosis not present

## 2021-11-09 DIAGNOSIS — H938X2 Other specified disorders of left ear: Secondary | ICD-10-CM | POA: Diagnosis not present

## 2021-11-09 MED ORDER — PREDNISONE 10 MG (21) PO TBPK: ORAL_TABLET | ORAL | 0 refills | Status: DC

## 2021-11-09 MED ORDER — FLUTICASONE PROPIONATE 50 MCG/ACT NA SUSP: 2.0000 | Freq: Every day | NASAL | 6 refills | Status: DC

## 2021-11-09 NOTE — Progress Notes (Signed)
Virtual Visit Consent   Louis Walters, you are scheduled for a virtual visit with a [facility] provider today.     Just as with appointments in the office, your consent must be obtained to participate.  Your consent will be active for this visit and any virtual visit you may have with one of our providers in the next 365 days.     If you have a MyChart account, a copy of this consent can be sent to you electronically.  All virtual visits are billed to your insurance company just like a traditional visit in the office.    As this is a virtual visit, video technology does not allow for your provider to perform a traditional examination.  This may limit your provider's ability to fully assess your condition.  If your provider identifies any concerns that need to be evaluated in person or the need to arrange testing (such as labs, EKG, etc.), we will make arrangements to do so.     Although advances in technology are sophisticated, we cannot ensure that it will always work on either your end or our end.  If the connection with a video visit is poor, the visit may have to be switched to a telephone visit.  With either a video or telephone visit, we are not always able to ensure that we have a secure connection.     I need to obtain your verbal consent now.   Are you willing to proceed with your visit today?    Louis Walters has provided verbal consent on 11/09/2021 for a virtual visit (video or telephone).   Evelina Dun, FNP   Date: 11/09/2021 9:23 AM   Virtual Visit via Video Note   I, Evelina Dun, connected with  Louis Walters  ([id number], May 13, 1976) [birth year] at  9:30 AM EST by a video-enabled telemedicine application and verified that I am speaking with the correct person using two identifiers.  Location: Patient: Virtual Visit Location Patient: Home Provider: Virtual Visit Location Provider: Home Office   I discussed the limitations of evaluation and management by  telemedicine and the availability of in person appointments. The patient expressed understanding and agreed to proceed.    History of Present Illness: Louis Walters is a 45 y.o. who identifies as a male who was assigned male at birth, and is being seen today for ear fullness that started 10/31/21. He had a prescription of Augmentin and took BID for 5 days. He has also been zyrtec and decongestant with mild relief. States the pain has resolved starting Augmentin.   HPI: Ear Fullness  There is pain in the left ear. This is a new problem. The current episode started in the past 7 days. The problem occurs every few minutes. The problem has been waxing and waning. There has been no fever. The pain is at a severity of 0/10. The patient is experiencing no pain. Associated symptoms include hearing loss. Pertinent negatives include no coughing, ear discharge, headaches, rhinorrhea or sore throat. Associated symptoms comments: ringing. He has tried antibiotics for the symptoms. The treatment provided mild relief.   Problems:  Patient Active Problem List   Diagnosis Date Noted   Overweight (BMI 25.0-29.9) 09/26/2019   Bilateral plantar fasciitis 04/25/2015   Back pain 04/25/2015   Routine general medical examination at a health care facility 01/19/2014    Allergies:  Allergies  Allergen Reactions   Celebrex [Celecoxib] Rash   Medications:  Current Outpatient Medications:  COVID-19 At Home Antigen Test [facility] COVID-19 HOME TEST) KIT, Use as directed, Disp: 4 each, Rfl: 0  Observations/Objective: Patient is well-developed, well-nourished in no acute distress.  Resting comfortably  at home.  Head is normocephalic, atraumatic.  No labored breathing.  Speech is clear and coherent with logical content.  Patient is alert and oriented at baseline.    Assessment and Plan: 1. Sensation of fullness in left ear  2. Tinnitus of left ear  3. Dysfunction of left eustachian tube Continue zyrtec,  flonase, and decongestant Force fluids Will give steroids to help with inflammation -For fever or aces or pains- take tylenol or ibuprofen. If symptoms worsen or do not improve will need to be seen in person.    Follow Up Instructions: I discussed the assessment and treatment plan with the patient. The patient was provided an opportunity to ask questions and all were answered. The patient agreed with the plan and demonstrated an understanding of the instructions.  A copy of instructions were sent to the patient via MyChart unless otherwise noted below.     The patient was advised to call back or seek an in-person evaluation if the symptoms worsen or if the condition fails to improve as anticipated.  Time:  I spent 12 minutes with the patient via telehealth technology discussing the above problems/concerns.    Evelina Dun, FNP

## 2022-01-26 ENCOUNTER — Encounter: Payer: Self-pay | Admitting: Family Medicine

## 2022-01-26 ENCOUNTER — Ambulatory Visit (INDEPENDENT_AMBULATORY_CARE_PROVIDER_SITE_OTHER): Payer: No Typology Code available for payment source | Admitting: Family Medicine

## 2022-01-26 VITALS — BP 102/78 | HR 74 | Temp 97.7°F | Resp 16 | Ht 75.25 in | Wt 217.4 lb

## 2022-01-26 DIAGNOSIS — Z23 Encounter for immunization: Secondary | ICD-10-CM | POA: Diagnosis not present

## 2022-01-26 DIAGNOSIS — E663 Overweight: Secondary | ICD-10-CM

## 2022-01-26 DIAGNOSIS — Z0000 Encounter for general adult medical examination without abnormal findings: Secondary | ICD-10-CM

## 2022-01-26 LAB — BASIC METABOLIC PANEL
BUN: 20 mg/dL (ref 6–23)
CO2: 30 mEq/L (ref 19–32)
Calcium: 9.3 mg/dL (ref 8.4–10.5)
Chloride: 103 mEq/L (ref 96–112)
Creatinine, Ser: 1.31 mg/dL (ref 0.40–1.50)
GFR: 66.39 mL/min (ref >60.00)
Glucose, Bld: 93 mg/dL (ref 70–99)
Potassium: 3.9 mEq/L (ref 3.5–5.1)
Sodium: 138 mEq/L (ref 135–145)

## 2022-01-26 LAB — CBC WITH DIFFERENTIAL/PLATELET
Basophils Absolute: 0.2 10*3/uL — ABNORMAL HIGH (ref 0.0–0.1)
Basophils Relative: 4.8 % — ABNORMAL HIGH (ref 0.0–3.0)
Eosinophils Absolute: 0.1 10*3/uL (ref 0.0–0.7)
Eosinophils Relative: 2.1 % (ref 0.0–5.0)
HCT: 42.7 % (ref 39.0–52.0)
Hemoglobin: 14.3 g/dL (ref 13.0–17.0)
Lymphocytes Relative: 32.1 % (ref 12.0–46.0)
Lymphs Abs: 1.4 10*3/uL (ref 0.7–4.0)
MCHC: 33.5 g/dL (ref 30.0–36.0)
MCV: 90.1 fl (ref 78.0–100.0)
Monocytes Absolute: 0.3 10*3/uL (ref 0.1–1.0)
Monocytes Relative: 7.1 % (ref 3.0–12.0)
Neutro Abs: 2.3 10*3/uL (ref 1.4–7.7)
Neutrophils Relative %: 53.9 % (ref 43.0–77.0)
Platelets: 198 10*3/uL (ref 150.0–400.0)
RBC: 4.74 Mil/uL (ref 4.22–5.81)
RDW: 13.3 % (ref 11.5–15.5)
WBC: 4.3 10*3/uL (ref 4.0–10.5)

## 2022-01-26 LAB — LIPID PANEL
Cholesterol: 178 mg/dL (ref 0–200)
HDL: 35.9 mg/dL — ABNORMAL LOW (ref >39.00)
LDL Cholesterol: 107 mg/dL — ABNORMAL HIGH (ref 0–99)
NonHDL: 142.33
Total CHOL/HDL Ratio: 5
Triglycerides: 177 mg/dL — ABNORMAL HIGH (ref 0.0–149.0)
VLDL: 35.4 mg/dL (ref 0.0–40.0)

## 2022-01-26 LAB — HEPATIC FUNCTION PANEL
ALT: 29 U/L (ref 0–53)
AST: 25 U/L (ref 0–37)
Albumin: 4.5 g/dL (ref 3.5–5.2)
Alkaline Phosphatase: 60 U/L (ref 39–117)
Bilirubin, Direct: 0.1 mg/dL (ref 0.0–0.3)
Total Bilirubin: 0.7 mg/dL (ref 0.2–1.2)
Total Protein: 6.9 g/dL (ref 6.0–8.3)

## 2022-01-26 LAB — TSH: TSH: 2.63 u[IU]/mL (ref 0.35–5.50)

## 2022-01-26 NOTE — Assessment & Plan Note (Signed)
Pt's PE WNL.  UTD on flu, COVID.  Tdap given.  Check labs.  Anticipatory guidance provided.

## 2022-01-26 NOTE — Patient Instructions (Signed)
Follow up in 1 year or as needed We'll notify you of your lab results and make any changes if needed Continue to work on healthy diet and regular exercise- you look great! Call with any questions or concerns Stay Safe!  Stay Healthy! Have a [employer]!!

## 2022-01-26 NOTE — Progress Notes (Signed)
° °  Subjective:    Patient ID: Louis Walters, male    DOB: 01/30/1976, 46 y.o.   MRN: [id number]  HPI CPE- Due for Tdap.  UTD on flu.  No concerns today.  Health Maintenance  Topic Date Due   TETANUS/TDAP  01/17/2021   INFLUENZA VACCINE  Completed   Hepatitis C Screening  Completed   HIV Screening  Completed   HPV VACCINES  Aged Out      Review of Systems Patient reports no vision/hearing changes, anorexia, fever ,adenopathy, persistant/recurrent hoarseness, swallowing issues, chest pain, palpitations, edema, persistant/recurrent cough, hemoptysis, dyspnea (rest,exertional, paroxysmal nocturnal), gastrointestinal  bleeding (melena, rectal bleeding), abdominal pain, excessive heart burn, GU symptoms (dysuria, hematuria, voiding/incontinence issues) syncope, focal weakness, memory loss, numbness & tingling, skin/hair/nail changes, depression, anxiety, abnormal bruising/bleeding, musculoskeletal symptoms/signs.   This visit occurred during the SARS-CoV-2 public health emergency.  Safety protocols were in place, including screening questions prior to the visit, additional usage of staff PPE, and extensive cleaning of exam room while observing appropriate contact time as indicated for disinfecting solutions.      Objective:   Physical Exam General Appearance:    Alert, cooperative, no distress, appears stated age  Head:    Normocephalic, without obvious abnormality, atraumatic  Eyes:    PERRL, conjunctiva/corneas clear, EOM's intact, fundi    benign, both eyes       Ears:    Normal TM's and external ear canals, both ears  Nose:   Deferred due to COVID  Throat:   Neck:   Supple, symmetrical, trachea midline, no adenopathy;       thyroid:  No enlargement/tenderness/nodules  Back:     Symmetric, no curvature, ROM normal, no CVA tenderness  Lungs:     Clear to auscultation bilaterally, respirations unlabored  Chest wall:    No tenderness or deformity  Heart:    Regular rate and rhythm, S1  and S2 normal, no murmur, rub   or gallop  Abdomen:     Soft, non-tender, bowel sounds active all four quadrants,    no masses, no organomegaly  Genitalia:    deferred  Rectal:    Extremities:   Extremities normal, atraumatic, no cyanosis or edema  Pulses:   2+ and symmetric all extremities  Skin:   Skin color, texture, turgor normal, no rashes or lesions  Lymph nodes:   Cervical, supraclavicular, and axillary nodes normal  Neurologic:   CNII-XII intact. Normal strength, sensation and reflexes      throughout          Assessment & Plan:

## 2022-05-13 ENCOUNTER — Encounter: Payer: Self-pay | Admitting: Family Medicine

## 2022-05-13 ENCOUNTER — Ambulatory Visit (INDEPENDENT_AMBULATORY_CARE_PROVIDER_SITE_OTHER): Payer: No Typology Code available for payment source | Admitting: Family Medicine

## 2022-05-13 ENCOUNTER — Ambulatory Visit (INDEPENDENT_AMBULATORY_CARE_PROVIDER_SITE_OTHER): Payer: No Typology Code available for payment source

## 2022-05-13 ENCOUNTER — Other Ambulatory Visit (HOSPITAL_COMMUNITY): Payer: Self-pay

## 2022-05-13 VITALS — BP 122/82 | HR 83 | Ht 75.0 in | Wt 212.0 lb

## 2022-05-13 DIAGNOSIS — M545 Low back pain, unspecified: Secondary | ICD-10-CM | POA: Diagnosis not present

## 2022-05-13 MED ORDER — MELOXICAM 15 MG PO TABS
15.0000 mg | ORAL_TABLET | Freq: Every day | ORAL | 0 refills | Status: DC
  Filled 2022-05-13: qty 30, 30d supply, fill #0

## 2022-05-13 NOTE — Progress Notes (Unsigned)
[facility] [address] [place] [address] Phone: [phone] Subjective:    I'm seeing this patient by the request  of:  Sheliah Hatch, MD  CC: Back pain  LPF:[id number]  Louis Walters is a 46 y.o. male coming in with complaint of lumbar spine pain for past 5 weeks. Patient states that his pain radiates down back of his leg to his ankle. Pain increases with sit to stand and in mornings. Sitting with knees extended. Using IBU for pain relief. Has tried some stretches.       Past Medical History:  Diagnosis Date   COVID-19 07/2019   History of chicken pox    No disease found    Past Surgical History:  Procedure Laterality Date   LASIK     Social History   Socioeconomic History   Marital status: Married    Spouse name: Not on file   Number of children: Not on file   Years of education: Not on file   Highest education level: Not on file  Occupational History   Not on file  Tobacco Use   Smoking status: Never   Smokeless tobacco: Never  Vaping Use   Vaping Use: Never used  Substance and Sexual Activity   Alcohol use: No   Drug use: No   Sexual activity: Yes  Other Topics Concern   Not on file  Social History Narrative   Not on file   Social Determinants of Health   Financial Resource Strain: Not on file  Food Insecurity: Not on file  Transportation Needs: Not on file  Physical Activity: Not on file  Stress: Not on file  Social Connections: Not on file   Allergies  Allergen Reactions   Celebrex [Celecoxib] Rash   Family History  Problem Relation Age of Onset   Osteoporosis Mother    Cancer Maternal Grandfather 22       colon       Current Outpatient Medications (Analgesics):    meloxicam (MOBIC) 15 MG tablet, Take 1 tablet (15 mg total) by mouth daily.     Reviewed prior external information including notes and imaging from  primary care provider As well as notes that were available from care  everywhere and other healthcare systems.  Past medical history, social, surgical and family history all reviewed in electronic medical record.  No pertanent information unless stated regarding to the chief complaint.   Review of Systems:  No headache, visual changes, nausea, vomiting, diarrhea, constipation, dizziness, abdominal pain, skin rash, fevers, chills, night sweats, weight loss, swollen lymph nodes, body aches, joint swelling, chest pain, shortness of breath, mood changes. POSITIVE muscle aches  Objective  Blood pressure 122/82, pulse 83, height 6\' 3"  (1.905 m), weight 212 lb (96.2 kg), SpO2 97 %.   General: No apparent distress alert and oriented x3 mood and affect normal, dressed appropriately.  HEENT: Pupils equal, extraocular movements intact  Respiratory: Patient's speak in full sentences and does not appear short of breath  Cardiovascular: No lower extremity edema, non tender, no erythema  Low back exam does have some mild loss of lordosis.  Patient does have some mild discomfort noted in the paraspinal musculature of the lumbar spine.  Patient does have some radicular symptoms noted in the 30 degrees with tightness of the hamstring.  Likely no significant weakness though noted in the lower extremity and deep tendon reflexes are intact.   [id number]; 15 additional minutes spent for Therapeutic exercises  as stated in above notes.  This included exercises focusing on stretching, strengthening, with significant focus on eccentric aspects.   Long term goals include an improvement in range of motion, strength, endurance as well as avoiding reinjury. Patient's frequency would include in 1-2 times a day, 3-5 times a week for a duration of 6-12 weeks.  Low back exercises that included:  Pelvic tilt/bracing instruction to focus on control of the pelvic girdle and lower abdominal muscles  Glute strengthening exercises, focusing on proper firing of the glutes without engaging the low back  muscles Proper stretching techniques for maximum relief for the hamstrings, hip flexors, low back and some rotation where tolerated Proper technique shown and discussed handout in great detail with ATC.  All questions were discussed and answered.     Impression and Recommendations:     The above documentation has been reviewed and is accurate and complete Judi Saa, DO

## 2022-05-13 NOTE — Patient Instructions (Addendum)
Meloxicam 15mg  daily for 10 days then as need Icing Do prescribed exercises at least 3x a week Xray today  See you again in 2 months if not perfect

## 2022-05-14 NOTE — Assessment & Plan Note (Signed)
Based on patient's story as well as symptomatology I do believe that patient did have a protruding.  Likely this contributing still to some nerve root impingement but patient is making improvement.  We discussed with patient about different medications and he was open for the meloxicam.  Patient has taken it before even though patient does have a rash with Celebrex and has never had difficulty with the meloxicam.  We discussed potential gabapentin which patient declined.  Home exercises with athletic trainer given.  X-rays are pending.  I believe patient will do very well with conservative therapy.  If no significant improvement in 6 to 8 weeks we will consider formal physical therapy or advanced imaging if any weakness is noted.

## 2022-07-14 ENCOUNTER — Ambulatory Visit: Payer: No Typology Code available for payment source | Admitting: Family Medicine

## 2023-02-23 IMAGING — DX DG LUMBAR SPINE 2-3V
3 series · 3 of 3 positions shown · non-contrast
Comparison: None Available.

CLINICAL DATA: Low back pain.

EXAM:
LUMBAR SPINE - 2-3 VIEW

[l-spine ap]
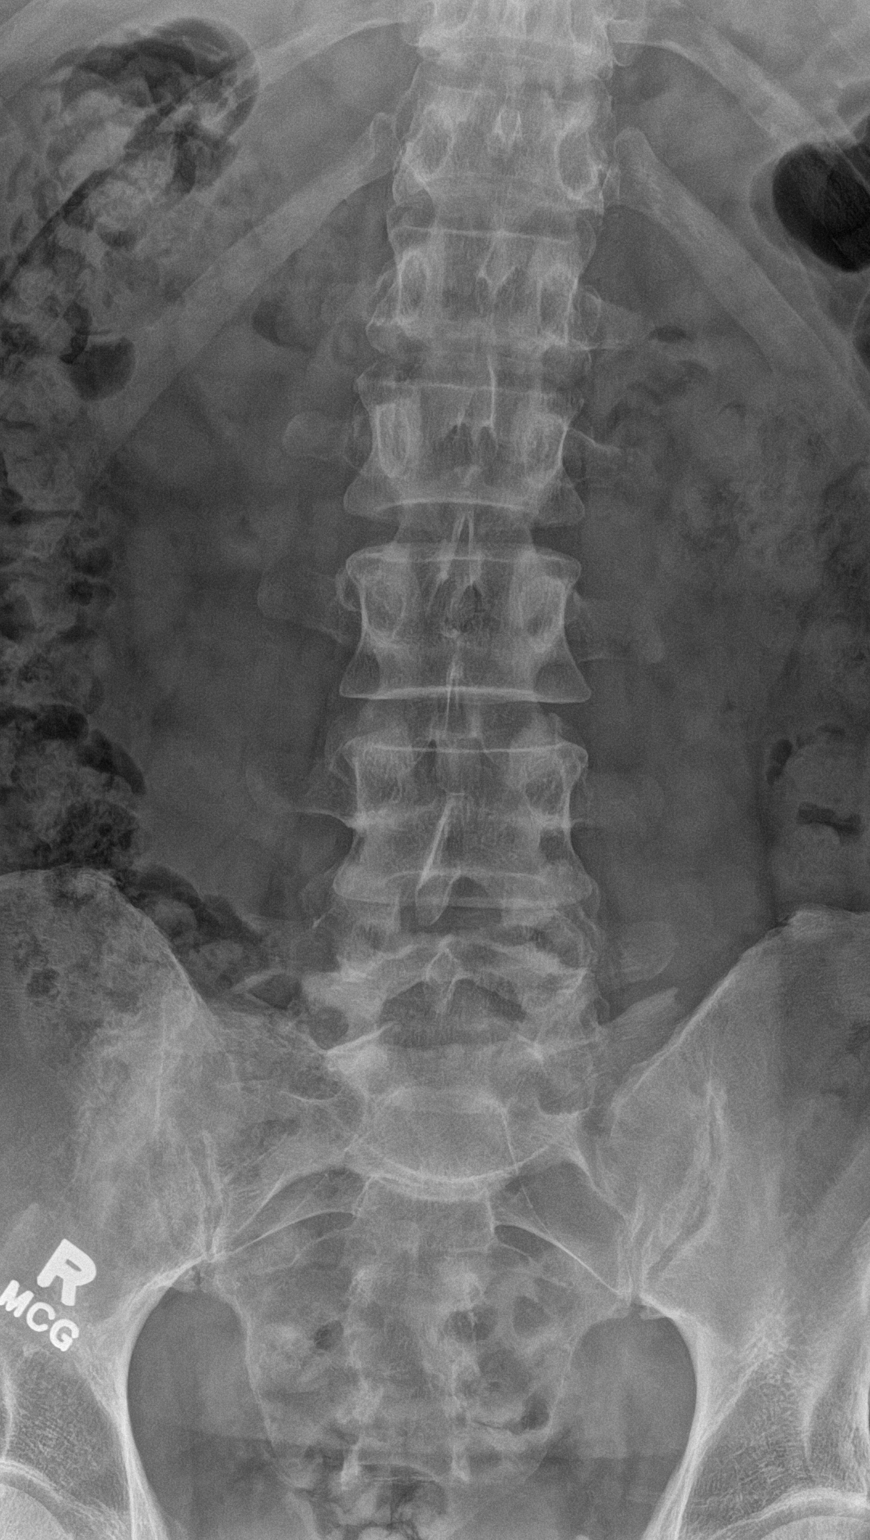

[l-spine lateral]
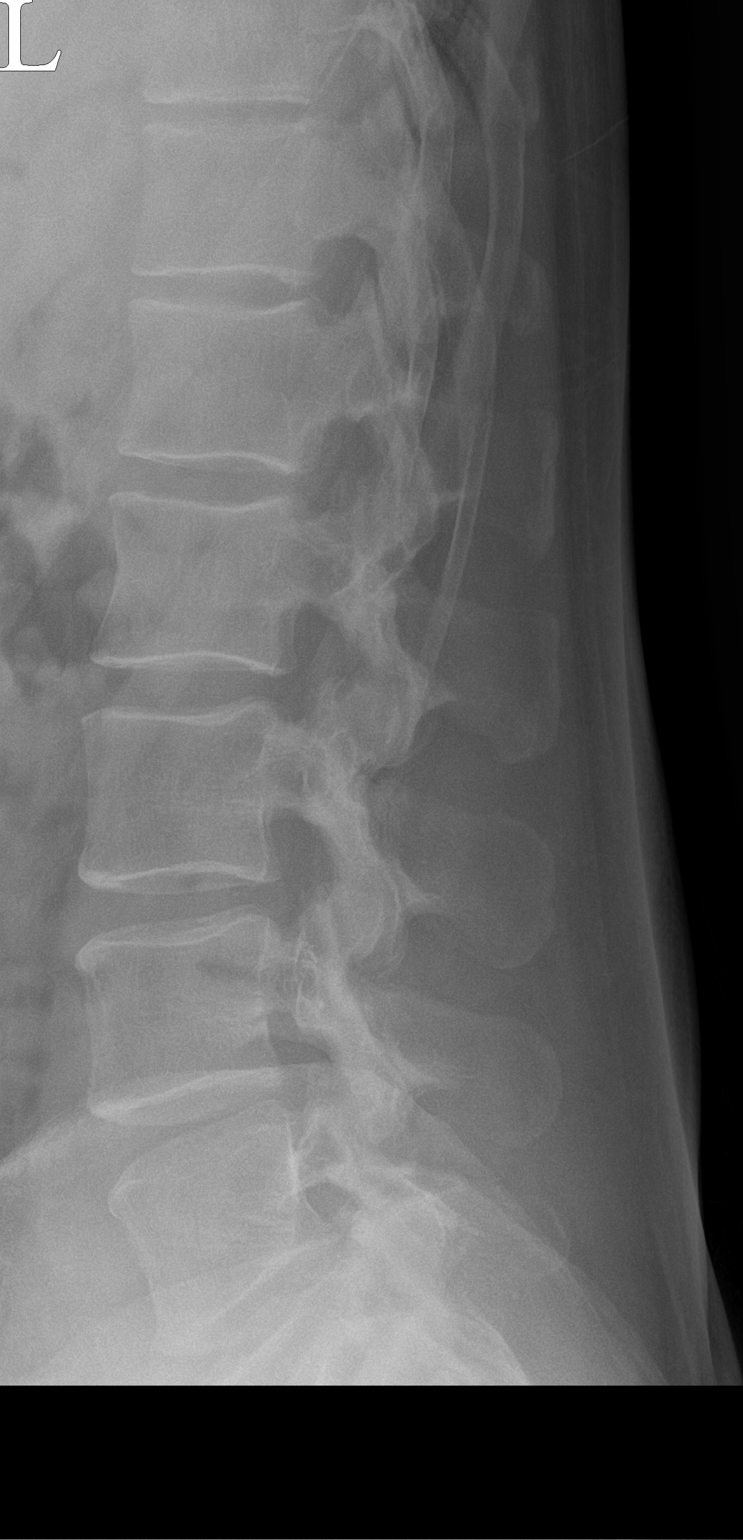

[l-spine spot]
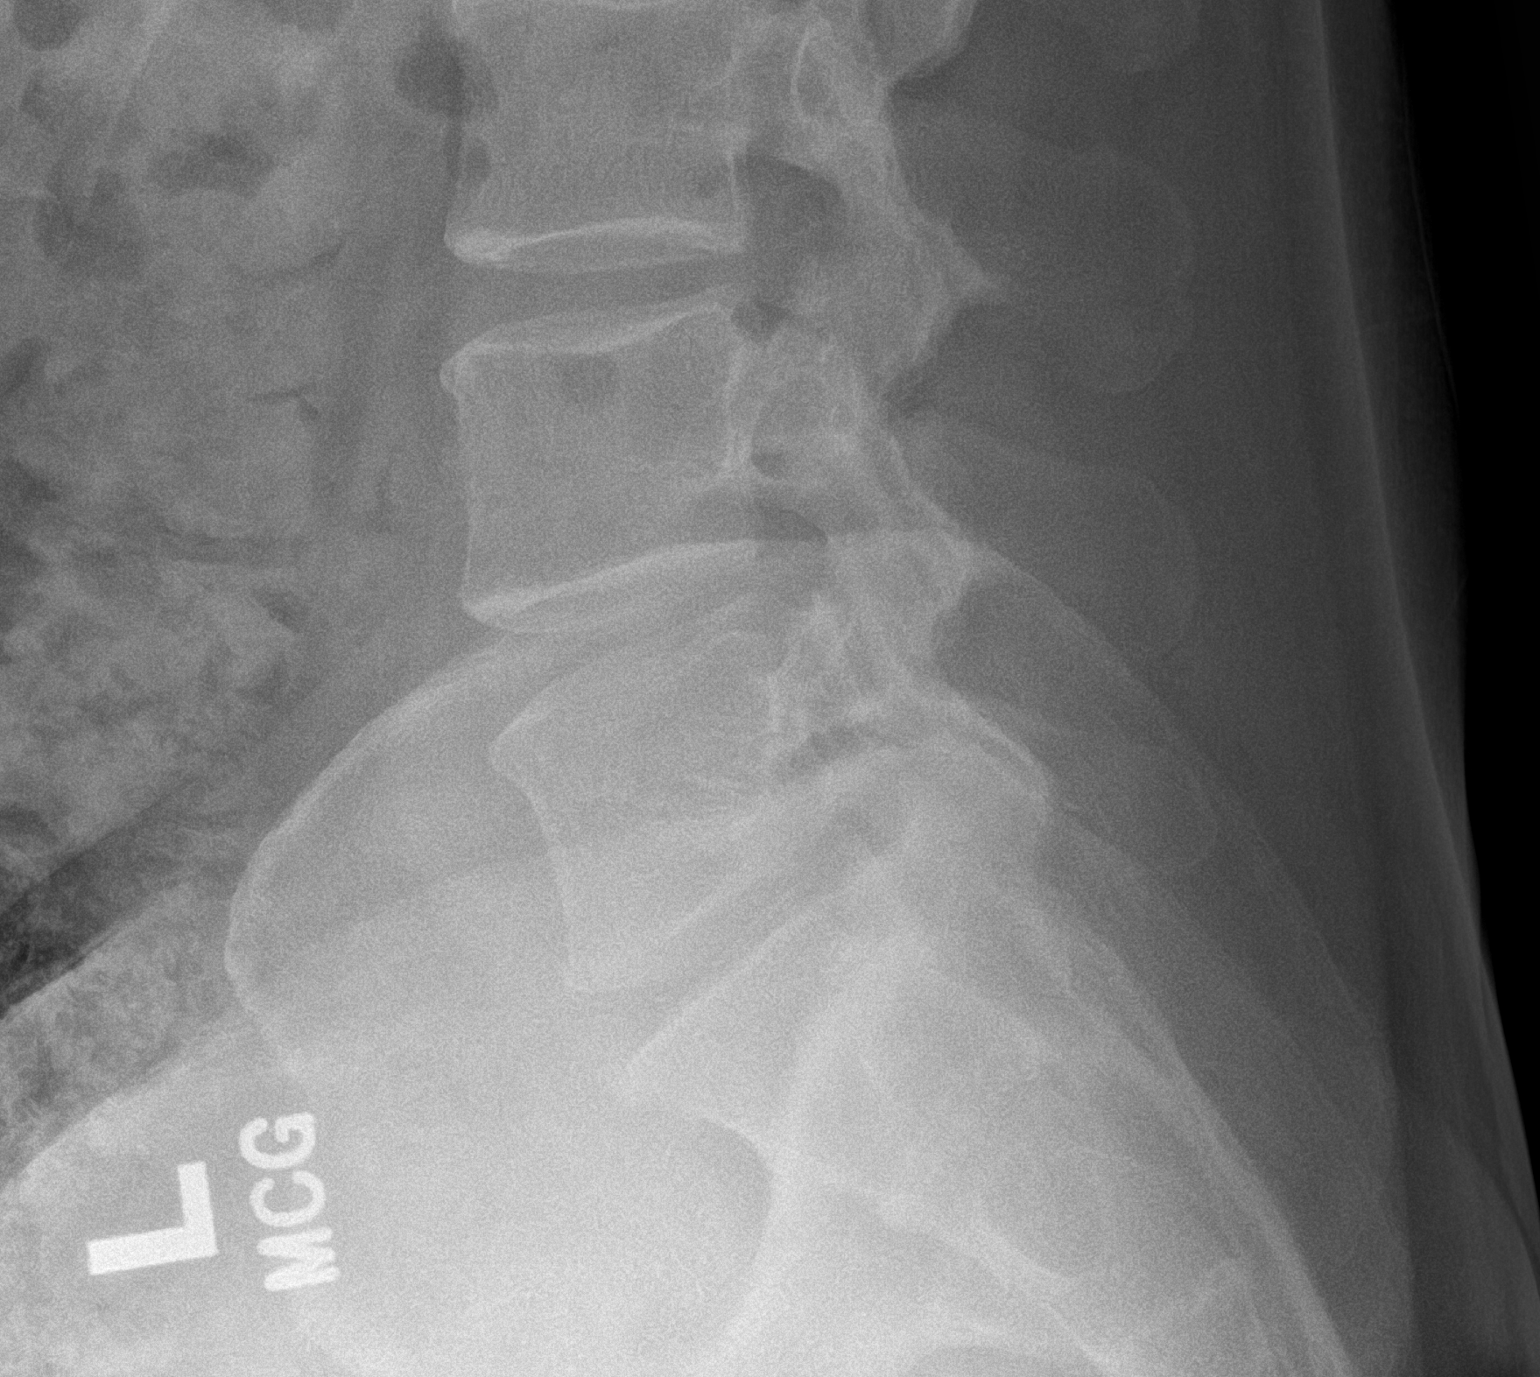

[3 of 3 positions shown; findings below may reference images not displayed]

FINDINGS: Mild rightward curvature of the lumbar spine. Preservation of the
vertebral body heights. L4-5 and L5-S1 degenerative disc disease.
Lower lumbar spine facet degenerative changes. No acute process. SI
joints unremarkable.
IMPRESSION: Lower lumbar spine degenerative disc and facet disease.

## 2023-04-16 ENCOUNTER — Encounter: Payer: 59 | Admitting: Family Medicine

## 2023-04-30 ENCOUNTER — Encounter: Payer: 59 | Admitting: Family Medicine

## 2023-05-27 ENCOUNTER — Ambulatory Visit (INDEPENDENT_AMBULATORY_CARE_PROVIDER_SITE_OTHER): Payer: 59 | Admitting: Family Medicine

## 2023-05-27 ENCOUNTER — Encounter: Payer: Self-pay | Admitting: Family Medicine

## 2023-05-27 ENCOUNTER — Telehealth: Payer: Self-pay

## 2023-05-27 VITALS — BP 114/78 | HR 67 | Temp 97.9°F | Resp 17 | Ht 75.0 in | Wt 209.2 lb

## 2023-05-27 DIAGNOSIS — E663 Overweight: Secondary | ICD-10-CM

## 2023-05-27 DIAGNOSIS — Z1211 Encounter for screening for malignant neoplasm of colon: Secondary | ICD-10-CM

## 2023-05-27 DIAGNOSIS — Z0000 Encounter for general adult medical examination without abnormal findings: Secondary | ICD-10-CM

## 2023-05-27 LAB — HEPATIC FUNCTION PANEL
ALT: 29 U/L (ref 0–53)
AST: 30 U/L (ref 0–37)
Albumin: 4.6 g/dL (ref 3.5–5.2)
Alkaline Phosphatase: 57 U/L (ref 39–117)
Bilirubin, Direct: 0.1 mg/dL (ref 0.0–0.3)
Total Bilirubin: 0.7 mg/dL (ref 0.2–1.2)
Total Protein: 7.6 g/dL (ref 6.0–8.3)

## 2023-05-27 LAB — CBC WITH DIFFERENTIAL/PLATELET
Basophils Absolute: 0.1 10*3/uL (ref 0.0–0.1)
Basophils Relative: 1.1 % (ref 0.0–3.0)
Eosinophils Absolute: 0.1 10*3/uL (ref 0.0–0.7)
Eosinophils Relative: 1.9 % (ref 0.0–5.0)
HCT: 45.3 % (ref 39.0–52.0)
Hemoglobin: 14.9 g/dL (ref 13.0–17.0)
Lymphocytes Relative: 25.9 % (ref 12.0–46.0)
Lymphs Abs: 1.3 10*3/uL (ref 0.7–4.0)
MCHC: 32.9 g/dL (ref 30.0–36.0)
MCV: 91.8 fl (ref 78.0–100.0)
Monocytes Absolute: 0.3 10*3/uL (ref 0.1–1.0)
Monocytes Relative: 6.1 % (ref 3.0–12.0)
Neutro Abs: 3.2 10*3/uL (ref 1.4–7.7)
Neutrophils Relative %: 65 % (ref 43.0–77.0)
Platelets: 208 10*3/uL (ref 150.0–400.0)
RBC: 4.94 Mil/uL (ref 4.22–5.81)
RDW: 12.9 % (ref 11.5–15.5)
WBC: 5 10*3/uL (ref 4.0–10.5)

## 2023-05-27 LAB — BASIC METABOLIC PANEL
BUN: 15 mg/dL (ref 6–23)
CO2: 29 mEq/L (ref 19–32)
Calcium: 9.6 mg/dL (ref 8.4–10.5)
Chloride: 104 mEq/L (ref 96–112)
Creatinine, Ser: 1.38 mg/dL (ref 0.40–1.50)
GFR: 61.79 mL/min (ref >60.00)
Glucose, Bld: 81 mg/dL (ref 70–99)
Potassium: 4.7 mEq/L (ref 3.5–5.1)
Sodium: 140 mEq/L (ref 135–145)

## 2023-05-27 LAB — LIPID PANEL
Cholesterol: 132 mg/dL (ref 0–200)
HDL: 40.7 mg/dL (ref >39.00)
LDL Cholesterol: 60 mg/dL (ref 0–99)
NonHDL: 91.06
Total CHOL/HDL Ratio: 3
Triglycerides: 156 mg/dL — ABNORMAL HIGH (ref 0.0–149.0)
VLDL: 31.2 mg/dL (ref 0.0–40.0)

## 2023-05-27 LAB — TSH: TSH: 2.37 u[IU]/mL (ref 0.35–5.50)

## 2023-05-27 NOTE — Telephone Encounter (Signed)
-----   Message from Sheliah Hatch, MD sent at 05/27/2023  2:44 PM EDT ----- Labs look great!  No changes at this time

## 2023-05-27 NOTE — Assessment & Plan Note (Signed)
Pt's PE WNL.  UTD on Tdap.  GI referral placed.  Check labs.  Anticipatory guidance provided.

## 2023-05-27 NOTE — Progress Notes (Signed)
   Subjective:    Patient ID: Louis Walters, male    DOB: 1976/10/28, 47 y.o.   MRN: [id number]  HPI CPE- UTD on Tdap.  Due for colonoscopy  Patient Care Team    Relationship Specialty Notifications Start End  Sheliah Hatch, MD PCP - General Family Medicine  01/19/14    Comment: Merged     Health Maintenance  Topic Date Due   Colonoscopy  Never done   INFLUENZA VACCINE  07/22/2023   DTaP/Tdap/Td (3 - Td or Tdap) 01/27/2032   Hepatitis C Screening  Completed   HIV Screening  Completed   HPV VACCINES  Aged Out   COVID-19 Vaccine  Discontinued      Review of Systems Patient reports no vision/hearing changes, anorexia, fever ,adenopathy, persistant/recurrent hoarseness, swallowing issues, chest pain, palpitations, edema, persistant/recurrent cough, hemoptysis, dyspnea (rest,exertional, paroxysmal nocturnal), gastrointestinal  bleeding (melena, rectal bleeding), abdominal pain, excessive heart burn, GU symptoms (dysuria, hematuria, voiding/incontinence issues) syncope, focal weakness, memory loss, numbness & tingling, skin/hair/nail changes, depression, anxiety, abnormal bruising/bleeding, musculoskeletal symptoms/signs.     Objective:   Physical Exam General Appearance:    Alert, cooperative, no distress, appears stated age  Head:    Normocephalic, without obvious abnormality, atraumatic  Eyes:    PERRL, conjunctiva/corneas clear, EOM's intact both eyes       Ears:    Normal TM's and external ear canals, both ears  Nose:   Nares normal, septum midline, mucosa normal, no drainage   or sinus tenderness  Throat:   Lips, mucosa, and tongue normal; teeth and gums normal  Neck:   Supple, symmetrical, trachea midline, no adenopathy;       thyroid:  No enlargement/tenderness/nodules  Back:     Symmetric, no curvature, ROM normal, no CVA tenderness  Lungs:     Clear to auscultation bilaterally, respirations unlabored  Chest wall:    No tenderness or deformity  Heart:    Regular  rate and rhythm, S1 and S2 normal, no murmur, rub   or gallop  Abdomen:     Soft, non-tender, bowel sounds active all four quadrants,    no masses, no organomegaly  Genitalia:    deferred  Rectal:    Extremities:   Extremities normal, atraumatic, no cyanosis or edema  Pulses:   2+ and symmetric all extremities  Skin:   Skin color, texture, turgor normal, no rashes or lesions  Lymph nodes:   Cervical, supraclavicular, and axillary nodes normal  Neurologic:   CNII-XII intact. Normal strength, sensation and reflexes      throughout          Assessment & Plan:

## 2023-05-27 NOTE — Patient Instructions (Signed)
Follow up in 1 year or as needed We'll notify you of your lab results and make any changes if needed Keep up the good work on healthy diet and regular exercise- you look great!!! Call with any questions or concerns Stay Safe!  Stay Healthy! Have a great summer!!! 

## 2023-06-04 ENCOUNTER — Encounter: Payer: Self-pay | Admitting: Gastroenterology

## 2023-06-29 ENCOUNTER — Ambulatory Visit (AMBULATORY_SURGERY_CENTER): Payer: 59

## 2023-06-29 ENCOUNTER — Other Ambulatory Visit (HOSPITAL_COMMUNITY): Payer: Self-pay

## 2023-06-29 VITALS — Ht 75.0 in | Wt 208.0 lb

## 2023-06-29 DIAGNOSIS — Z1211 Encounter for screening for malignant neoplasm of colon: Secondary | ICD-10-CM

## 2023-06-29 MED ORDER — NA SULFATE-K SULFATE-MG SULF 17.5-3.13-1.6 GM/177ML PO SOLN
1.0000 | Freq: Once | ORAL | 0 refills | Status: AC
  Filled 2023-06-29: qty 354, 2d supply, fill #0

## 2023-06-29 NOTE — Progress Notes (Signed)

## 2023-07-08 ENCOUNTER — Ambulatory Visit: Payer: 59 | Admitting: Family Medicine

## 2023-07-15 ENCOUNTER — Encounter: Payer: Self-pay | Admitting: Gastroenterology

## 2023-07-20 ENCOUNTER — Ambulatory Visit: Payer: 59 | Admitting: Family Medicine

## 2023-07-27 ENCOUNTER — Ambulatory Visit (AMBULATORY_SURGERY_CENTER): Payer: 59 | Admitting: Gastroenterology

## 2023-07-27 ENCOUNTER — Other Ambulatory Visit (INDEPENDENT_AMBULATORY_CARE_PROVIDER_SITE_OTHER): Payer: 59

## 2023-07-27 ENCOUNTER — Encounter: Payer: Self-pay | Admitting: Gastroenterology

## 2023-07-27 VITALS — BP 111/60 | HR 91 | Temp 97.5°F | Resp 18 | Ht 75.0 in | Wt 203.0 lb

## 2023-07-27 DIAGNOSIS — Z1211 Encounter for screening for malignant neoplasm of colon: Secondary | ICD-10-CM | POA: Diagnosis not present

## 2023-07-27 DIAGNOSIS — K633 Ulcer of intestine: Secondary | ICD-10-CM

## 2023-07-27 DIAGNOSIS — K5730 Diverticulosis of large intestine without perforation or abscess without bleeding: Secondary | ICD-10-CM

## 2023-07-27 LAB — C-REACTIVE PROTEIN: CRP: 10.6 mg/dL (ref 0.5–20.0)

## 2023-07-27 LAB — SEDIMENTATION RATE: Sed Rate: 29 mm/hr — ABNORMAL HIGH (ref 0–15)

## 2023-07-27 MED ORDER — SODIUM CHLORIDE 0.9 % IV SOLN: 500.0000 mL | Freq: Once | INTRAVENOUS | Status: DC

## 2023-07-27 NOTE — Progress Notes (Signed)
Pt's states no medical or surgical changes since previsit or office visit. 

## 2023-07-27 NOTE — Patient Instructions (Signed)
Resume previous diet and medications. Awaiting pathology results. Check ESR, CRP, and Fecal Calprotectin based on pathology results.  YOU HAD AN ENDOSCOPIC PROCEDURE TODAY AT THE [facility]:   Refer to the procedure report that was given to you for any specific questions about what was found during the examination.  If the procedure report does not answer your questions, please call your gastroenterologist to clarify.  If you requested that your care partner not be given the details of your procedure findings, then the procedure report has been included in a sealed envelope for you to review at your convenience later.  YOU SHOULD EXPECT: Some feelings of bloating in the abdomen. Passage of more gas than usual.  Walking can help get rid of the air that was put into your GI tract during the procedure and reduce the bloating. If you had a lower endoscopy (such as a colonoscopy or flexible sigmoidoscopy) you may notice spotting of blood in your stool or on the toilet paper. If you underwent a bowel prep for your procedure, you may not have a normal bowel movement for a few days.  Please Note:  You might notice some irritation and congestion in your nose or some drainage.  This is from the oxygen used during your procedure.  There is no need for concern and it should clear up in a day or so.  SYMPTOMS TO REPORT IMMEDIATELY:  Following lower endoscopy (colonoscopy or flexible sigmoidoscopy):  Excessive amounts of blood in the stool  Significant tenderness or worsening of abdominal pains  Swelling of the abdomen that is new, acute  Fever of 100F or higher  For urgent or emergent issues, a gastroenterologist can be reached at any hour by calling [phone]. Do not use MyChart messaging for urgent concerns.    DIET:  We do recommend a small meal at first, but then you may proceed to your regular diet.  Drink plenty of fluids but you should avoid alcoholic beverages for 24  hours.  ACTIVITY:  You should plan to take it easy for the rest of today and you should NOT DRIVE or use heavy machinery until tomorrow (because of the sedation medicines used during the test).    FOLLOW UP: Our staff will call the number listed on your records the next business day following your procedure.  We will call around 7:15- 8:00 am to check on you and address any questions or concerns that you may have regarding the information given to you following your procedure. If we do not reach you, we will leave a message.     If any biopsies were taken you will be contacted by phone or by letter within the next 1-3 weeks.  Please call us at [phone] if you have not heard about the biopsies in 3 weeks.    SIGNATURES/CONFIDENTIALITY: You and/or your care partner have signed paperwork which will be entered into your electronic medical record.  These signatures attest to the fact that that the information above on your After Visit Summary has been reviewed and is understood.  Full responsibility of the confidentiality of this discharge information lies with you and/or your care-partner.

## 2023-07-27 NOTE — Progress Notes (Signed)
Sedate, gd SR, tolerated procedure well, VSS, report to RN 

## 2023-07-27 NOTE — Progress Notes (Signed)
   GASTROENTEROLOGY PROCEDURE H&P NOTE   Primary Care Physician: Sheliah Hatch, MD    Reason for Procedure:  Colon Cancer screening  Plan:    Colonoscopy  Patient is appropriate for endoscopic procedure(s) in the ambulatory ([facility]) setting.  The nature of the procedure, as well as the risks, benefits, and alternatives were carefully and thoroughly reviewed with the patient. Ample time for discussion and questions allowed. The patient understood, was satisfied, and agreed to proceed.     HPI: Louis Walters is a 47 y.o. male who presents for colonoscopy for routine Colon Cancer screening.  No active GI symptoms.    MGF with colon cancer at age 43.   Past Medical History:  Diagnosis Date   COVID-19 07/2019   History of chicken pox    No disease found     Past Surgical History:  Procedure Laterality Date   LASIK      Prior to Admission medications   Not on File    No current outpatient medications on file.   Current Facility-Administered Medications  Medication Dose Route Frequency Provider Last Rate Last Admin   0.9 %  sodium chloride infusion  500 mL Intravenous Once Deretha Ertle V, DO        Allergies as of 07/27/2023 - Review Complete 07/27/2023  Allergen Reaction Noted   Celebrex [celecoxib] Rash 01/17/2014    Family History  Problem Relation Age of Onset   Osteoporosis Mother    Colon cancer Maternal Grandfather    Cancer Maternal Grandfather 27       colon   Colon polyps Neg Hx    Esophageal cancer Neg Hx    Rectal cancer Neg Hx    Stomach cancer Neg Hx     Social History   Socioeconomic History   Marital status: Married    Spouse name: Not on file   Number of children: Not on file   Years of education: Not on file   Highest education level: Not on file  Occupational History   Not on file  Tobacco Use   Smoking status: Never   Smokeless tobacco: Never  Vaping Use   Vaping status: Never Used  Substance and Sexual Activity    Alcohol use: No   Drug use: No   Sexual activity: Yes  Other Topics Concern   Not on file  Social History Narrative   Not on file   Social Determinants of Health   Financial Resource Strain: Not on file  Food Insecurity: Not on file  Transportation Needs: Not on file  Physical Activity: Not on file  Stress: Not on file  Social Connections: Not on file  Intimate Partner Violence: Not on file    Physical Exam: Vital signs in last 24 hours: @BP  125/80   Pulse 92   Temp (!) 97.5 F (36.4 C)   Ht 6\' 3"  (1.905 m)   Wt 203 lb (92.1 kg)   SpO2 98%   BMI 25.37 kg/m  GEN: NAD EYE: Sclerae anicteric ENT: MMM CV: Non-tachycardic Pulm: CTA b/l GI: Soft, NT/ND NEURO:  Alert & Oriented x 3   Doristine Locks, DO [facility]   07/27/2023 2:15 PM

## 2023-07-27 NOTE — Op Note (Signed)
[facility] Patient Name: Louis Walters Procedure Date: 07/27/2023 2:07 PM MRN: [id number] Endoscopist: Doristine Locks , MD, [id number] Age: 47 Referring MD:  Date of Birth: 1976/06/06 Gender: Male Account #: [id number] Procedure:                Colonoscopy Indications:              Screening for colorectal malignant neoplasm, This                            is the patient's first colonoscopy Medicines:                Monitored Anesthesia Care Procedure:                Pre-Anesthesia Assessment:                           - Prior to the procedure, a History and Physical                            was performed, and patient medications and                            allergies were reviewed. The patient's tolerance of                            previous anesthesia was also reviewed. The risks                            and benefits of the procedure and the sedation                            options and risks were discussed with the patient.                            All questions were answered, and informed consent                            was obtained. Prior Anticoagulants: The patient has                            taken no anticoagulant or antiplatelet agents. ASA                            Grade Assessment: II - A patient with mild systemic                            disease. After reviewing the risks and benefits,                            the patient was deemed in satisfactory condition to                            undergo the procedure.  After obtaining informed consent, the colonoscope                            was passed under direct vision. Throughout the                            procedure, the patient's blood pressure, pulse, and                            oxygen saturations were monitored continuously. The                            Olympus Scope [id number] was introduced through the                            anus and advanced to  the 10 cm into the ileum. The                            colonoscopy was performed without difficulty. The                            patient tolerated the procedure well. The quality                            of the bowel preparation was good. The terminal                            ileum, ileocecal valve, appendiceal orifice, and                            rectum were photographed. Scope In: 2:21:03 PM Scope Out: 2:35:33 PM Scope Withdrawal Time: 0 hours 10 minutes 59 seconds  Total Procedure Duration: 0 hours 14 minutes 30 seconds  Findings:                 The perianal and digital rectal examinations were                            normal.                           A few small-mouthed diverticula were found in the                            sigmoid colon and transverse colon.                           Normal mucosa was found in the entire colon.                           The retroflexed view of the distal rectum and anal                            verge was normal and showed no anal or rectal  abnormalities.                           The terminal ileum contained a few three mm ulcers.                            No bleeding was present. The ulcers were limited to                            the distal 3-4 cm of the ileum, with normal mucosa                            in between the ulcers. The colonsocope was advanced                            to 10 cm from the ICV and no additional ulcers                            noted. Biopsies were taken with a cold forceps for                            histology. Estimated blood loss was minimal. Complications:            No immediate complications. Estimated Blood Loss:     Estimated blood loss was minimal. Impression:               - Diverticulosis in the sigmoid colon and in the                            transverse colon.                           - Normal mucosa in the entire examined colon.                            - The distal rectum and anal verge are normal on                            retroflexion view.                           - A few ulcers in the terminal ileum. Biopsied. Recommendation:           - Patient has a contact number available for                            emergencies. The signs and symptoms of potential                            delayed complications were discussed with the                            patient. Return to normal activities tomorrow.  Written discharge instructions were provided to the                            patient.                           - Resume previous diet.                           - Continue present medications.                           - Await pathology results.                           - Check ESR, CRP, and fecal calprotectin for                            baseline evaluation given the noted ileal ulcers on                            today's study.                           - Repeat colonoscopy for surveillance based on                            pathology results.                           - Will discuss if any further evaluation or                            treatment is needed pending review of the pathology                            results. Doristine Locks, MD 07/27/2023 2:42:11 PM

## 2023-07-27 NOTE — Progress Notes (Signed)
Called to room to assist during endoscopic procedure.  Patient ID and intended procedure confirmed with present staff. Received instructions for my participation in the procedure from the performing physician.  

## 2023-07-28 ENCOUNTER — Telehealth: Payer: Self-pay | Admitting: *Deleted

## 2023-07-28 NOTE — Telephone Encounter (Signed)
  Follow up Call-     07/27/2023    1:42 PM  Call back number  Post procedure Call Back phone  # [phone]  Permission to leave phone message Yes     Patient questions:  Do you have a fever, pain , or abdominal swelling? No. Pain Score  0 *  Have you tolerated food without any problems? Yes.    Have you been able to return to your normal activities? Yes.    Do you have any questions about your discharge instructions: Diet   No. Medications  No. Follow up visit  No.  Do you have questions or concerns about your Care? No.  Actions: * If pain score is 4 or above: No action needed, pain <4.

## 2023-08-04 ENCOUNTER — Ambulatory Visit: Payer: 59

## 2023-08-04 DIAGNOSIS — Z1211 Encounter for screening for malignant neoplasm of colon: Secondary | ICD-10-CM

## 2023-08-04 DIAGNOSIS — K633 Ulcer of intestine: Secondary | ICD-10-CM | POA: Diagnosis not present

## 2023-08-04 DIAGNOSIS — K5730 Diverticulosis of large intestine without perforation or abscess without bleeding: Secondary | ICD-10-CM

## 2023-08-05 ENCOUNTER — Telehealth: Payer: Self-pay | Admitting: Gastroenterology

## 2023-08-05 NOTE — Telephone Encounter (Signed)
Inbound call from patient requesting to speak with Dr. Barron Alvine regarding recent colonoscopy results. Please advise, thank you.

## 2023-08-06 ENCOUNTER — Ambulatory Visit: Payer: 59 | Admitting: Family Medicine

## 2023-08-06 ENCOUNTER — Encounter: Payer: Self-pay | Admitting: Family Medicine

## 2023-08-06 VITALS — BP 124/74 | HR 115 | Temp 97.9°F | Resp 17 | Ht 75.0 in | Wt 207.1 lb

## 2023-08-06 DIAGNOSIS — R058 Other specified cough: Secondary | ICD-10-CM | POA: Diagnosis not present

## 2023-08-06 MED ORDER — ALBUTEROL SULFATE HFA 108 (90 BASE) MCG/ACT IN AERS: 2.0000 | INHALATION_SPRAY | Freq: Four times a day (QID) | RESPIRATORY_TRACT | 0 refills | Status: AC | PRN

## 2023-08-06 MED ORDER — PREDNISONE 10 MG PO TABS: ORAL_TABLET | ORAL | 0 refills | Status: AC

## 2023-08-06 NOTE — Progress Notes (Signed)
   Subjective:    Patient ID: Louis Walters, male    DOB: January 22, 1976, 47 y.o.   MRN: [id number]  HPI Cough- 2 weeks ago was sick.  Other sxs resolved but cough remained.  Has SOB w/ minimal exertion.  + fatigue.  Has not had fever in the last week.  Denies body aches.  Cough is mostly dry, hacking.  No HA.  No sinus pain/pressure or congestion.  + sick contacts.     Review of Systems For ROS see HPI     Objective:   Physical Exam Vitals reviewed.  Constitutional:      General: He is not in acute distress.    Appearance: Normal appearance. He is well-developed. He is not ill-appearing.  HENT:     Head: Normocephalic and atraumatic.     Right Ear: Tympanic membrane and ear canal normal.     Left Ear: Tympanic membrane and ear canal normal.     Nose: No mucosal edema, congestion or rhinorrhea.     Right Sinus: No maxillary sinus tenderness or frontal sinus tenderness.     Left Sinus: No maxillary sinus tenderness or frontal sinus tenderness.     Mouth/Throat:     Mouth: Mucous membranes are normal.     Pharynx: No oropharyngeal exudate, posterior oropharyngeal edema or posterior oropharyngeal erythema.  Eyes:     Extraocular Movements: EOM normal.     Conjunctiva/sclera: Conjunctivae normal.     Pupils: Pupils are equal, round, and reactive to light.  Cardiovascular:     Rate and Rhythm: Normal rate and regular rhythm.     Heart sounds: Normal heart sounds.  Pulmonary:     Effort: Pulmonary effort is normal. No respiratory distress.     Breath sounds: Normal breath sounds. No wheezing or rhonchi.     Comments: + dry, hacking cough Musculoskeletal:     Cervical back: Normal range of motion and neck supple.  Lymphadenopathy:     Cervical: No cervical adenopathy.  Skin:    General: Skin is warm and dry.  Neurological:     General: No focal deficit present.     Mental Status: He is alert and oriented to person, place, and time.  Psychiatric:        Mood and Affect: Mood  normal.        Behavior: Behavior normal.        Thought Content: Thought content normal.           Assessment & Plan:  Post viral cough- new.  Based on pt's report, entire family had COVID a few weeks ago (son's test was faintly +).  Everyone else is recovering but he continues to have dry hacking cough and SOB w/ exertion.  Lungs CTAB.  Start Prednisone taper and albuterol inhaler PRN.  Reviewed supportive care and red flags that should prompt return.  Pt expressed understanding and is in agreement w/ plan.

## 2023-08-06 NOTE — Patient Instructions (Signed)
Follow up as needed or as scheduled START the Prednisone as directed- 3 pills at the same time x3 days, then 2 pills at the same time x3 days, then 1 pill daily.  Take w/ food  USE the Albuterol as needed for cough/wheezing/shortness of breath Drink LOTS of fluids REST when needed Ease back into activity Call with any questions or concerns Hang in there!!!

## 2023-08-07 LAB — CALPROTECTIN, FECAL: Calprotectin, Fecal: 56 ug/g (ref 0–120)

## 2023-08-12 NOTE — Telephone Encounter (Signed)
Results discussed with patient at length by phone.  Please see result note for details.  Will close this encounter.

## 2024-02-04 ENCOUNTER — Telehealth: Payer: Self-pay | Admitting: *Deleted

## 2024-02-04 DIAGNOSIS — K633 Ulcer of intestine: Secondary | ICD-10-CM

## 2024-02-04 NOTE — Telephone Encounter (Signed)
Contacted patient to remind him to have labwork next week or first week of March. Patient verbalizes understanding. Orders are in EPIC.

## 2024-02-04 NOTE — Telephone Encounter (Signed)
-----   Message from [facility] Nabina B sent at 08/12/2023  1:54 PM EDT ----- Regarding: Labs due Check ESR, CRP, fecal calprotectin in 6 months as a surrogate marker of inflammatory change. (See surgical path report for details).

## 2024-02-11 ENCOUNTER — Other Ambulatory Visit (INDEPENDENT_AMBULATORY_CARE_PROVIDER_SITE_OTHER): Payer: 59

## 2024-02-11 DIAGNOSIS — K633 Ulcer of intestine: Secondary | ICD-10-CM | POA: Diagnosis not present

## 2024-02-11 LAB — C-REACTIVE PROTEIN: CRP: <1 mg/dL (ref 0.5–20.0)

## 2024-02-11 LAB — SEDIMENTATION RATE: Sed Rate: 7 mm/h (ref 0–15)

## 2024-02-14 ENCOUNTER — Encounter: Payer: Self-pay | Admitting: Gastroenterology

## 2024-02-25 ENCOUNTER — Other Ambulatory Visit

## 2024-02-25 DIAGNOSIS — K633 Ulcer of intestine: Secondary | ICD-10-CM | POA: Diagnosis not present

## 2024-02-28 ENCOUNTER — Encounter: Payer: Self-pay | Admitting: Gastroenterology

## 2024-02-28 LAB — CALPROTECTIN, FECAL: Calprotectin, Fecal: 68 ug/g (ref 0–120)
